# Patient Record
Sex: Female | Born: 1970 | Race: White | Hispanic: No | Marital: Married | Smoking: Current some day smoker
Health system: Southern US, Community
[De-identification: ages and names within clinical notes are randomized; demographics above are authoritative.]

## PROBLEM LIST (undated history)

## (undated) DIAGNOSIS — M549 Dorsalgia, unspecified: Secondary | ICD-10-CM

## (undated) DIAGNOSIS — J449 Chronic obstructive pulmonary disease, unspecified: Secondary | ICD-10-CM

## (undated) DIAGNOSIS — I10 Essential (primary) hypertension: Secondary | ICD-10-CM

## (undated) DIAGNOSIS — G8929 Other chronic pain: Secondary | ICD-10-CM

## (undated) HISTORY — PX: BACK SURGERY: SHX140

## (undated) HISTORY — PX: APPENDECTOMY: SHX54

## (undated) HISTORY — PX: NECK SURGERY: SHX720

## (undated) HISTORY — PX: KNEE ARTHROSCOPY: SHX127

---

## 2003-07-23 ENCOUNTER — Other Ambulatory Visit: Payer: Self-pay

## 2010-03-10 ENCOUNTER — Inpatient Hospital Stay: Payer: Self-pay | Admitting: Psychiatry

## 2010-04-19 ENCOUNTER — Ambulatory Visit: Payer: Self-pay | Admitting: Internal Medicine

## 2010-04-19 ENCOUNTER — Inpatient Hospital Stay (HOSPITAL_COMMUNITY): Admission: EM | Admit: 2010-04-19 | Discharge: 2010-04-20 | Payer: Self-pay | Admitting: Emergency Medicine

## 2010-04-19 DIAGNOSIS — N390 Urinary tract infection, site not specified: Secondary | ICD-10-CM | POA: Insufficient documentation

## 2010-04-19 DIAGNOSIS — N200 Calculus of kidney: Secondary | ICD-10-CM | POA: Insufficient documentation

## 2010-04-22 ENCOUNTER — Encounter: Payer: Self-pay | Admitting: Internal Medicine

## 2010-04-22 DIAGNOSIS — F4001 Agoraphobia with panic disorder: Secondary | ICD-10-CM

## 2010-04-22 DIAGNOSIS — M542 Cervicalgia: Secondary | ICD-10-CM | POA: Insufficient documentation

## 2010-04-22 DIAGNOSIS — F411 Generalized anxiety disorder: Secondary | ICD-10-CM | POA: Insufficient documentation

## 2010-04-22 DIAGNOSIS — I10 Essential (primary) hypertension: Secondary | ICD-10-CM | POA: Insufficient documentation

## 2010-04-22 DIAGNOSIS — F4310 Post-traumatic stress disorder, unspecified: Secondary | ICD-10-CM

## 2010-04-22 DIAGNOSIS — M549 Dorsalgia, unspecified: Secondary | ICD-10-CM | POA: Insufficient documentation

## 2010-04-22 DIAGNOSIS — Z9089 Acquired absence of other organs: Secondary | ICD-10-CM | POA: Insufficient documentation

## 2010-04-22 DIAGNOSIS — Z8659 Personal history of other mental and behavioral disorders: Secondary | ICD-10-CM

## 2010-04-26 ENCOUNTER — Emergency Department (HOSPITAL_COMMUNITY): Admission: EM | Admit: 2010-04-26 | Discharge: 2010-04-26 | Payer: Self-pay | Admitting: Emergency Medicine

## 2010-05-11 ENCOUNTER — Emergency Department: Payer: Self-pay | Admitting: Emergency Medicine

## 2010-05-19 ENCOUNTER — Ambulatory Visit: Payer: Self-pay | Admitting: Emergency Medicine

## 2010-05-25 ENCOUNTER — Ambulatory Visit: Payer: Self-pay | Admitting: Emergency Medicine

## 2010-06-15 ENCOUNTER — Emergency Department: Payer: Self-pay | Admitting: Unknown Physician Specialty

## 2010-09-22 NOTE — Miscellaneous (Signed)
  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) Added new problem of POSTTRAUMATIC STRESS DISORDER (ICD-309.81) Added new problem of BACK PAIN, CHRONIC (ICD-724.5) Added new problem of NECK PAIN, CHRONIC (ICD-723.1) Added new problem of APPENDECTOMY, HX OF (ICD-V45.79) Added new problem of TUBAL LIGATION, HX OF (ICD-V26.51) Medications: Added new medication of CIPRO 250 MG TABS (CIPROFLOXACIN HCL) 1 tab by mouth twice aday for 5 days - Signed Added new medication of ZOFRAN 4 MG TABS (ONDANSETRON HCL) take one tab by mouth q4hrs as needed for nausea - Signed Added new medication of ABILIFY 5 MG TABS (ARIPIPRAZOLE) 1 tab by mouth daily at bedtime Added new medication of BUTRANS 10 MCG/HR PTWK (BUPRENORPHINE) 1 patch topical weekly on Monday Added new medication of CELEXA 20 MG TABS (CITALOPRAM HYDROBROMIDE) 1 tab by mouth every morning Added new medication of DOXEPIN HCL 50 MG CAPS (DOXEPIN HCL) 1 capsule by mouth twice daily Added new medication of FERREX 150 150 MG CAPS (POLYSACCHARIDE IRON COMPLEX) 1 capsule by mouth daily Added new medication of FOLIC ACID 1 MG TABS (FOLIC ACID) 1 tab by mouth every morning Added new medication of LISINOPRIL-HYDROCHLOROTHIAZIDE 20-12.5 MG TABS (LISINOPRIL-HYDROCHLOROTHIAZIDE) 1 tab by mouth every morning Added new medication of OMEPRAZOLE 20 MG TBEC (OMEPRAZOLE) 1 tab by mouth every morning Added new medication of OXYCODONE-ACETAMINOPHEN 5-325 MG TABS (OXYCODONE-ACETAMINOPHEN) 1/2 to 1 tab by mouth every 8hours as needed for breakthrough pain Added new medication of KLONOPIN 1 MG TABS (CLONAZEPAM) 1 tab by mouth three times a day as  needed Added new medication of SOMA 350 MG TABS (CARISOPRODOL) 1 tab by mouth three times a day Added new medication of * VITAMIN D2 50000 UNITS 1 capsule by mouth twice a week Rx of ZOFRAN 4 MG TABS (ONDANSETRON HCL) take one tab by mouth q4hrs as needed for nausea;  #30 x 0;  Signed;  Entered by: Almyra Deforest MD;  Authorized by: Almyra Deforest MD;  Method used: Print then Give to Patient Rx of CIPRO 250 MG TABS (CIPROFLOXACIN HCL) 1 tab by mouth twice aday for 5 days;  #10 x 0;  Signed;  Entered by: Almyra Deforest MD;  Authorized by: Almyra Deforest MD;  Method used: Print then Give to Patient    Prescriptions: CIPRO 250 MG TABS (CIPROFLOXACIN HCL) 1 tab by mouth twice aday for 5 days  #10 x 0   Entered and Authorized by:   Almyra Deforest MD   Signed by:   Almyra Deforest MD on 04/22/2010   Method used:   Print then Give to Patient   RxID:   [id number] ZOFRAN 4 MG TABS (ONDANSETRON HCL) take one tab by mouth q4hrs as needed for nausea  #30 x 0   Entered and Authorized by:   Almyra Deforest MD   Signed by:   Almyra Deforest MD on 04/22/2010   Method used:   Print then Give to Patient   RxID:   [id number]

## 2010-11-05 LAB — DIFFERENTIAL
Basophils Absolute: 0.1 K/uL (ref 0.0–0.1)
Basophils Relative: 1 % (ref 0–1)
Eosinophils Absolute: 0.9 K/uL — ABNORMAL HIGH (ref 0.0–0.7)
Eosinophils Relative: 7 % — ABNORMAL HIGH (ref 0–5)
Lymphocytes Relative: 22 % (ref 12–46)
Lymphs Abs: 2.8 K/uL (ref 0.7–4.0)
Monocytes Absolute: 1.2 K/uL — ABNORMAL HIGH (ref 0.1–1.0)
Monocytes Relative: 9 % (ref 3–12)
Neutro Abs: 8 K/uL — ABNORMAL HIGH (ref 1.7–7.7)
Neutrophils Relative %: 62 % (ref 43–77)

## 2010-11-05 LAB — COMPREHENSIVE METABOLIC PANEL
ALT: 17 U/L (ref 0–35)
AST: 19 U/L (ref 0–37)
Albumin: 3.9 g/dL (ref 3.5–5.2)
BUN: 11 mg/dL (ref 6–23)
CO2: 26 mEq/L (ref 19–32)
GFR calc non Af Amer: >60 mL/min (ref >60)
Glucose, Bld: 119 mg/dL — ABNORMAL HIGH (ref 70–99)
Potassium: 3.9 mEq/L (ref 3.5–5.1)
Sodium: 135 mEq/L (ref 135–145)
Total Protein: 7.4 g/dL (ref 6.0–8.3)

## 2010-11-05 LAB — LIPASE, BLOOD: Lipase: 37 U/L (ref 11–59)

## 2010-11-05 LAB — CBC
HCT: 35.7 % — ABNORMAL LOW (ref 36.0–46.0)
Hemoglobin: 11.1 g/dL — ABNORMAL LOW (ref 12.0–15.0)
MCH: 24.4 pg — ABNORMAL LOW (ref 26.0–34.0)
MCHC: 31.1 g/dL (ref 30.0–36.0)
MCV: 78.6 fL (ref 78.0–100.0)
Platelets: 433 K/uL — ABNORMAL HIGH (ref 150–400)
RBC: 4.54 MIL/uL (ref 3.87–5.11)
RDW: 15.8 % — ABNORMAL HIGH (ref 11.5–15.5)
WBC: 13 K/uL — ABNORMAL HIGH (ref 4.0–10.5)

## 2010-11-05 LAB — URINE CULTURE
Colony Count: NO GROWTH
Culture  Setup Time: 201109050000
Culture: NO GROWTH

## 2010-11-05 LAB — URINALYSIS, ROUTINE W REFLEX MICROSCOPIC
Glucose, UA: NEGATIVE mg/dL
Hgb urine dipstick: NEGATIVE
Protein, ur: NEGATIVE mg/dL
Specific Gravity, Urine: 1.007 (ref 1.005–1.030)

## 2010-11-05 LAB — POCT PREGNANCY, URINE: Preg Test, Ur: NEGATIVE

## 2010-11-06 LAB — URINALYSIS, ROUTINE W REFLEX MICROSCOPIC
Bilirubin Urine: NEGATIVE
Glucose, UA: NEGATIVE mg/dL
Glucose, UA: NEGATIVE mg/dL
Ketones, ur: NEGATIVE mg/dL
Ketones, ur: NEGATIVE mg/dL
Specific Gravity, Urine: 1.006 (ref 1.005–1.030)
Urobilinogen, UA: 0.2 mg/dL (ref 0.0–1.0)
pH: 5.5 (ref 5.0–8.0)

## 2010-11-06 LAB — BASIC METABOLIC PANEL
BUN: 8 mg/dL (ref 6–23)
Chloride: 107 mEq/L (ref 96–112)
Glucose, Bld: 107 mg/dL — ABNORMAL HIGH (ref 70–99)
Sodium: 140 mEq/L (ref 135–145)

## 2010-11-06 LAB — CALCIUM, URINE, RANDOM: Calcium, Ur: 1 mg/dL

## 2010-11-06 LAB — POCT I-STAT, CHEM 8
Calcium, Ion: 1.1 mmol/L — ABNORMAL LOW (ref 1.12–1.32)
Chloride: 105 mEq/L (ref 96–112)
Glucose, Bld: 90 mg/dL (ref 70–99)
HCT: 37 % (ref 36.0–46.0)
Potassium: 4.5 mEq/L (ref 3.5–5.1)

## 2010-11-06 LAB — HEPATIC FUNCTION PANEL
Alkaline Phosphatase: 77 U/L (ref 39–117)
Total Bilirubin: 0.2 mg/dL — ABNORMAL LOW (ref 0.3–1.2)
Total Protein: 6.4 g/dL (ref 6.0–8.3)

## 2010-11-06 LAB — URINE CULTURE: Culture  Setup Time: 201108281142

## 2010-11-06 LAB — RAPID URINE DRUG SCREEN, HOSP PERFORMED
Cocaine: NOT DETECTED
Tetrahydrocannabinol: NOT DETECTED

## 2010-11-06 LAB — DIFFERENTIAL
Basophils Absolute: 0 10*3/uL (ref 0.0–0.1)
Basophils Relative: 0 % (ref 0–1)
Eosinophils Absolute: 1 10*3/uL — ABNORMAL HIGH (ref 0.0–0.7)
Eosinophils Relative: 10 % — ABNORMAL HIGH (ref 0–5)
Lymphocytes Relative: 20 % (ref 12–46)
Monocytes Absolute: 1.2 10*3/uL — ABNORMAL HIGH (ref 0.1–1.0)
Neutro Abs: 6.4 10*3/uL (ref 1.7–7.7)

## 2010-11-06 LAB — LIPASE, BLOOD: Lipase: 46 U/L (ref 11–59)

## 2010-11-06 LAB — URINE MICROSCOPIC-ADD ON

## 2010-11-06 LAB — HIV ANTIBODY (ROUTINE TESTING W REFLEX): HIV: NONREACTIVE

## 2010-11-06 LAB — CBC: RDW: 16.1 % — ABNORMAL HIGH (ref 11.5–15.5)

## 2013-07-22 ENCOUNTER — Observation Stay (HOSPITAL_COMMUNITY)
Admission: EM | Admit: 2013-07-22 | Discharge: 2013-07-25 | Disposition: A | Discharge status: Home / Self Care | Payer: Medicare Other | Attending: Internal Medicine | Admitting: Internal Medicine

## 2013-07-22 ENCOUNTER — Encounter (HOSPITAL_COMMUNITY): Payer: Self-pay | Admitting: Emergency Medicine

## 2013-07-22 ENCOUNTER — Emergency Department (HOSPITAL_COMMUNITY): Payer: Medicare Other

## 2013-07-22 DIAGNOSIS — Z720 Tobacco use: Secondary | ICD-10-CM | POA: Diagnosis present

## 2013-07-22 DIAGNOSIS — R0789 Other chest pain: Principal | ICD-10-CM | POA: Insufficient documentation

## 2013-07-22 DIAGNOSIS — R079 Chest pain, unspecified: Secondary | ICD-10-CM | POA: Diagnosis present

## 2013-07-22 DIAGNOSIS — K219 Gastro-esophageal reflux disease without esophagitis: Secondary | ICD-10-CM | POA: Insufficient documentation

## 2013-07-22 DIAGNOSIS — Z3202 Encounter for pregnancy test, result negative: Secondary | ICD-10-CM | POA: Insufficient documentation

## 2013-07-22 DIAGNOSIS — F17200 Nicotine dependence, unspecified, uncomplicated: Secondary | ICD-10-CM | POA: Insufficient documentation

## 2013-07-22 DIAGNOSIS — J441 Chronic obstructive pulmonary disease with (acute) exacerbation: Secondary | ICD-10-CM | POA: Insufficient documentation

## 2013-07-22 DIAGNOSIS — Z888 Allergy status to other drugs, medicaments and biological substances status: Secondary | ICD-10-CM | POA: Insufficient documentation

## 2013-07-22 DIAGNOSIS — R110 Nausea: Secondary | ICD-10-CM | POA: Insufficient documentation

## 2013-07-22 DIAGNOSIS — G8929 Other chronic pain: Secondary | ICD-10-CM | POA: Insufficient documentation

## 2013-07-22 DIAGNOSIS — I10 Essential (primary) hypertension: Secondary | ICD-10-CM | POA: Diagnosis present

## 2013-07-22 DIAGNOSIS — E663 Overweight: Secondary | ICD-10-CM | POA: Insufficient documentation

## 2013-07-22 DIAGNOSIS — L301 Dyshidrosis [pompholyx]: Secondary | ICD-10-CM | POA: Insufficient documentation

## 2013-07-22 DIAGNOSIS — I509 Heart failure, unspecified: Secondary | ICD-10-CM | POA: Insufficient documentation

## 2013-07-22 DIAGNOSIS — Z880 Allergy status to penicillin: Secondary | ICD-10-CM | POA: Insufficient documentation

## 2013-07-22 HISTORY — DX: Other chronic pain: G89.29

## 2013-07-22 HISTORY — DX: Essential (primary) hypertension: I10

## 2013-07-22 HISTORY — DX: Dorsalgia, unspecified: M54.9

## 2013-07-22 HISTORY — DX: Chronic obstructive pulmonary disease, unspecified: J44.9

## 2013-07-22 LAB — CBC WITH DIFFERENTIAL/PLATELET
Basophils Absolute: 0 10*3/uL (ref 0.0–0.1)
Basophils Relative: 0 % (ref 0–1)
HCT: 38.1 % (ref 36.0–46.0)
Lymphocytes Relative: 24 % (ref 12–46)
MCHC: 33.6 g/dL (ref 30.0–36.0)
Neutro Abs: 8.1 10*3/uL — ABNORMAL HIGH (ref 1.7–7.7)
Neutrophils Relative %: 61 % (ref 43–77)
Platelets: 303 10*3/uL (ref 150–400)
RBC: 4.42 MIL/uL (ref 3.87–5.11)
RDW: 14.2 % (ref 11.5–15.5)
WBC: 13.3 10*3/uL — ABNORMAL HIGH (ref 4.0–10.5)

## 2013-07-22 LAB — POCT I-STAT TROPONIN I: Troponin i, poc: 0 ng/mL (ref 0.00–0.08)

## 2013-07-22 LAB — URINALYSIS, ROUTINE W REFLEX MICROSCOPIC
Leukocytes, UA: NEGATIVE
Nitrite: NEGATIVE
Specific Gravity, Urine: 1.008 (ref 1.005–1.030)
Urobilinogen, UA: 0.2 mg/dL (ref 0.0–1.0)

## 2013-07-22 LAB — BASIC METABOLIC PANEL
CO2: 27 mEq/L (ref 19–32)
Chloride: 100 mEq/L (ref 96–112)
Potassium: 3.9 mEq/L (ref 3.5–5.1)
Sodium: 135 mEq/L (ref 135–145)

## 2013-07-22 LAB — D-DIMER, QUANTITATIVE: D-Dimer, Quant: 0.64 ug/mL-FEU — ABNORMAL HIGH (ref 0.00–0.48)

## 2013-07-22 MED ORDER — MORPHINE SULFATE 2 MG/ML IJ SOLN
2.0000 mg | Freq: Once | INTRAMUSCULAR | Status: AC
  Administered 2013-07-22: 2 mg via INTRAVENOUS
  Filled 2013-07-22: qty 1

## 2013-07-22 MED ORDER — SODIUM CHLORIDE 0.9 % IV BOLUS (SEPSIS)
1000.0000 mL | Freq: Once | INTRAVENOUS | Status: AC
  Administered 2013-07-22: 1000 mL via INTRAVENOUS

## 2013-07-22 NOTE — ED Provider Notes (Signed)
CSN: [id number]     Arrival date & time 07/22/13  1841 History   First MD Initiated Contact with Patient 07/22/13 1844     Chief Complaint  Patient presents with  . Chest Pain   (Consider location/radiation/quality/duration/timing/severity/associated sxs/prior Treatment) HPI  This a 42 year old female who presents with chest pain. Patient has a history of GERD, hypertension, and reported history of congestive heart failure who presents with chest pain. Patient reports onset of symptoms at 9:30am.  She reports it is constant and heavy feeling. Patient reports her pain is 7/10. She was given nitroglycerin in route. She endorses shortness of breath and diaphoresis with the episode. She reports having a stress test in 2010 but has not followed by cardiologist. She reports current smoking history and an early family history of heart disease. She denies any recent hospitalizations or recent long travel. She denies any birth control use.  Past Medical History  Diagnosis Date  . Chronic back pain     pt treated by Pain Doctor  . Hypertension   . COPD (chronic obstructive pulmonary disease)    Past Surgical History  Procedure Laterality Date  . Back surgery    . Appendectomy    . Knee arthroscopy Left   . Neck surgery     Family History  Problem Relation Age of Onset  . CAD Mother   . CAD Maternal Uncle   . Breast cancer Maternal Aunt    History  Substance Use Topics  . Smoking status: Current Some Day Smoker -- 0.50 packs/day    Types: Cigarettes  . Smokeless tobacco: Not on file  . Alcohol Use: No   OB History   Grav Para Term Preterm Abortions TAB SAB Ect Mult Living                 Review of Systems  Constitutional: Negative for fever.  Respiratory: Positive for chest tightness and shortness of breath. Negative for cough.   Cardiovascular: Positive for chest pain.  Gastrointestinal: Positive for nausea. Negative for vomiting and abdominal pain.  Genitourinary: Negative  for dysuria.  Musculoskeletal: Negative for back pain.  Neurological: Negative for headaches.  Psychiatric/Behavioral: Negative for confusion.  All other systems reviewed and are negative.    Allergies  Bee venom; Penicillins; Abilify; Lyrica; Other; Reglan; and Tegretol  Home Medications   No current outpatient prescriptions on file. BP 98/58  Pulse 58  Temp(Src) 98.4 F (36.9 C) (Oral)  Resp 18  Ht 5\' 7"  (1.702 m)  Wt 263 lb 8 oz (119.523 kg)  BMI 41.26 kg/m2  SpO2 98%  LMP 06/27/2013 Physical Exam  Nursing note and vitals reviewed. Constitutional: She is oriented to person, place, and time. No distress.  Overweight  HENT:  Head: Normocephalic and atraumatic.  Cardiovascular: Normal rate, regular rhythm and normal heart sounds.   No murmur heard. Pulmonary/Chest: Effort normal and breath sounds normal. No respiratory distress. She has no wheezes.  Abdominal: Soft. Bowel sounds are normal. There is no tenderness.  Musculoskeletal: She exhibits no edema.  Neurological: She is alert and oriented to person, place, and time.  Skin: Skin is warm and dry.  Psychiatric: She has a normal mood and affect.    ED Course  Procedures (including critical care time) Labs Review Labs Reviewed  CBC WITH DIFFERENTIAL - Abnormal; Notable for the following:    WBC 13.3 (*)    Neutro Abs 8.1 (*)    Monocytes Absolute 1.3 (*)    All other components  within normal limits  BASIC METABOLIC PANEL - Abnormal; Notable for the following:    Creatinine, Ser 1.29 (*)    GFR calc non Af Amer 51 (*)    GFR calc Af Amer 59 (*)    All other components within normal limits  D-DIMER, QUANTITATIVE - Abnormal; Notable for the following:    D-Dimer, Quant 0.64 (*)    All other components within normal limits  URINALYSIS, ROUTINE W REFLEX MICROSCOPIC  TROPONIN I  TSH  TROPONIN I  TROPONIN I  TROPONIN I  COMPREHENSIVE METABOLIC PANEL  CBC WITH DIFFERENTIAL  PREGNANCY, URINE  URINE RAPID DRUG  SCREEN (HOSP PERFORMED)  LIPID PANEL  CBC  POCT I-STAT TROPONIN I   Imaging Review Nm Pulmonary Perf And Vent  07/23/2013   CLINICAL DATA:  Chest pain for 15 hr. Evaluate for pulmonary embolism.  EXAM: NUCLEAR MEDICINE VENTILATION - PERFUSION LUNG SCAN  TECHNIQUE: Ventilation images were obtained in multiple projections using inhaled aerosol technetium 99 M DTPA. Perfusion images were obtained in multiple projections after intravenous injection of Tc-46m MAA.  COMPARISON:  Chest radiograph of 1 day prior.  RADIOPHARMACEUTICALS:  38.0 mCi Tc-7m DTPA aerosol and 6.0 mCi Tc-2m MAA  FINDINGS: Ventilation: No focal ventilation defect.  Perfusion: No wedge shaped peripheral perfusion defects to suggest acute pulmonary embolism.  IMPRESSION: No evidence of pulmonary embolism.   Electronically Signed   By: Jeronimo Greaves M.D.   On: 07/23/2013 02:19   Dg Chest Portable 1 View  07/22/2013   CLINICAL DATA:  Chest pain. The patient relates that she has been told that she has a "mass in her lung."  EXAM: PORTABLE CHEST - 1 VIEW  COMPARISON:  None.  FINDINGS: Thoracic spondylosis. Cardiac and mediastinal margins appear normal. The lungs appear clear. Cervical plate and screw fixator.  IMPRESSION: 1. Thoracic spondylosis. Otherwise, no significant abnormalities are observed. 2. I do not observe a mass in the patient's lungs. Clearly, CT would offer significantly better sensitivity and negative predictive value for pulmonary nodules, and would be the test of choice if there is concern for pulmonary nodule/malignancy.   Electronically Signed   By: Herbie Baltimore M.D.   On: 07/22/2013 19:46    EKG Interpretation    Date/Time:  Sunday July 22 2013 18:52:15 EST Ventricular Rate:  67 PR Interval:  143 QRS Duration: 83 QT Interval:  431 QTC Calculation: 455 R Axis:   56 Text Interpretation:  Sinus rhythm Normal ECG Confirmed by Trey Bebee  MD, Illene Sweeting ([id number]) on 07/22/2013 6:59:38 PM            MDM    1. Chest pain   2. HTN (hypertension)      Patient presents with chest pain. Initially noted to be hypotensive status post nitroglycerin.  She is otherwise nontoxic-appearing.  Patient does have risk factors for ACS including early family history, hypertension, and current smoking. She denies any recent fevers. Initially while the ER, patient remained persistently hypotensive and I was unable to give her pain medication or nitroglycerin. Patient was given 2 L of fluid. Patient is obese and I question the accuracy of her blood pressure readings. Blood pressure cuff was moved to her forearm with improvement of her readings.  Initial troponin is negative. EKG is nonischemic. Patient's d-dimer is positive. Patient will need a VQ scan because of hermild elevation in her creatinine. Outside records from [facility] were obtained and show the patient was admitted on October 5 for acute kidney injury. at  time of discharge patient blood pressure was 110 systolic.   IF VQ neg, patient to be admitted for CP r/o.  Signed out to Dr. Norlene Campbell.  Shon Baton, MD 07/23/13 [phone]

## 2013-07-22 NOTE — ED Notes (Signed)
Pt reports CP started at 0930 today. Pt has a HX of Gerd and felt that was the cause of CP and Pt reports a HX of Panic attacks . Pt reports the CP continued all day with nausea . EMS gve 3 ngt without relief and PT took 324 of ASA at home.

## 2013-07-23 ENCOUNTER — Emergency Department (HOSPITAL_COMMUNITY): Payer: Medicare Other

## 2013-07-23 ENCOUNTER — Encounter (HOSPITAL_COMMUNITY): Payer: Self-pay | Admitting: Internal Medicine

## 2013-07-23 DIAGNOSIS — R079 Chest pain, unspecified: Secondary | ICD-10-CM | POA: Diagnosis present

## 2013-07-23 DIAGNOSIS — Z720 Tobacco use: Secondary | ICD-10-CM | POA: Diagnosis present

## 2013-07-23 DIAGNOSIS — I10 Essential (primary) hypertension: Secondary | ICD-10-CM | POA: Diagnosis present

## 2013-07-23 LAB — CBC WITH DIFFERENTIAL/PLATELET
Eosinophils Relative: 8 % — ABNORMAL HIGH (ref 0–5)
HCT: 37 % (ref 36.0–46.0)
Hemoglobin: 12.2 g/dL (ref 12.0–15.0)
Lymphocytes Relative: 22 % (ref 12–46)
Lymphs Abs: 2 10*3/uL (ref 0.7–4.0)
MCH: 28.6 pg (ref 26.0–34.0)
MCV: 86.9 fL (ref 78.0–100.0)
Monocytes Absolute: 0.8 10*3/uL (ref 0.1–1.0)
Monocytes Relative: 9 % (ref 3–12)
Platelets: 257 10*3/uL (ref 150–400)
RBC: 4.26 MIL/uL (ref 3.87–5.11)
WBC: 9.1 10*3/uL (ref 4.0–10.5)

## 2013-07-23 LAB — LIPID PANEL
HDL: 33 mg/dL — ABNORMAL LOW (ref >39)
LDL Cholesterol: 91 mg/dL (ref 0–99)
Total CHOL/HDL Ratio: 4.6 RATIO
VLDL: 27 mg/dL (ref 0–40)

## 2013-07-23 LAB — COMPREHENSIVE METABOLIC PANEL
AST: 14 U/L (ref 0–37)
Albumin: 3 g/dL — ABNORMAL LOW (ref 3.5–5.2)
BUN: 10 mg/dL (ref 6–23)
CO2: 25 mEq/L (ref 19–32)
Chloride: 104 mEq/L (ref 96–112)
Creatinine, Ser: 1.05 mg/dL (ref 0.50–1.10)
GFR calc non Af Amer: 65 mL/min — ABNORMAL LOW (ref >90)
Potassium: 4.3 mEq/L (ref 3.5–5.1)
Sodium: 137 mEq/L (ref 135–145)
Total Protein: 6 g/dL (ref 6.0–8.3)

## 2013-07-23 LAB — TROPONIN I
Troponin I: <0.3 ng/mL (ref <0.30)
Troponin I: <0.3 ng/mL (ref <0.30)
Troponin I: <0.3 ng/mL (ref <0.30)
Troponin I: <0.3 ng/mL (ref <0.30)

## 2013-07-23 MED ORDER — SODIUM CHLORIDE 0.9 % IJ SOLN
3.0000 mL | Freq: Two times a day (BID) | INTRAMUSCULAR | Status: DC
  Administered 2013-07-23 – 2013-07-25 (×4): 3 mL via INTRAVENOUS

## 2013-07-23 MED ORDER — LISINOPRIL-HYDROCHLOROTHIAZIDE 20-12.5 MG PO TABS: 1.0000 | ORAL_TABLET | Freq: Every day | ORAL | Status: DC

## 2013-07-23 MED ORDER — ACETAMINOPHEN 325 MG PO TABS: 650.0000 mg | ORAL_TABLET | Freq: Four times a day (QID) | ORAL | Status: DC | PRN

## 2013-07-23 MED ORDER — FENTANYL CITRATE 0.05 MG/ML IJ SOLN
50.0000 ug | Freq: Once | INTRAMUSCULAR | Status: AC
  Administered 2013-07-23: 50 ug via INTRAVENOUS
  Filled 2013-07-23: qty 2

## 2013-07-23 MED ORDER — TECHNETIUM TO 99M ALBUMIN AGGREGATED: 6.0000 | Freq: Once | INTRAVENOUS | Status: DC | PRN

## 2013-07-23 MED ORDER — TECHNETIUM TC 99M DIETHYLENETRIAME-PENTAACETIC ACID: 40.0000 | Freq: Once | INTRAVENOUS | Status: DC | PRN

## 2013-07-23 MED ORDER — CLONAZEPAM 1 MG PO TABS
1.0000 mg | ORAL_TABLET | Freq: Four times a day (QID) | ORAL | Status: DC
  Administered 2013-07-23 – 2013-07-25 (×7): 1 mg via ORAL
  Filled 2013-07-23 (×8): qty 1

## 2013-07-23 MED ORDER — OXYCODONE HCL 5 MG PO TABS: 5.0000 mg | ORAL_TABLET | Freq: Four times a day (QID) | ORAL | Status: DC | PRN

## 2013-07-23 MED ORDER — ONDANSETRON HCL 4 MG PO TABS: 4.0000 mg | ORAL_TABLET | Freq: Four times a day (QID) | ORAL | Status: DC | PRN

## 2013-07-23 MED ORDER — ALBUTEROL SULFATE HFA 108 (90 BASE) MCG/ACT IN AERS
1.0000 | INHALATION_SPRAY | Freq: Four times a day (QID) | RESPIRATORY_TRACT | Status: DC | PRN
  Filled 2013-07-23: qty 6.7

## 2013-07-23 MED ORDER — OXYBUTYNIN CHLORIDE 5 MG PO TABS
5.0000 mg | ORAL_TABLET | Freq: Every day | ORAL | Status: DC
Start: 2013-07-23 — End: 2013-07-25
  Administered 2013-07-23 – 2013-07-25 (×3): 5 mg via ORAL
  Filled 2013-07-23 (×3): qty 1

## 2013-07-23 MED ORDER — HYDROCHLOROTHIAZIDE 12.5 MG PO CAPS
12.5000 mg | ORAL_CAPSULE | Freq: Every day | ORAL | Status: DC
  Administered 2013-07-24 – 2013-07-25 (×2): 12.5 mg via ORAL
  Filled 2013-07-23 (×3): qty 1

## 2013-07-23 MED ORDER — ENOXAPARIN SODIUM 60 MG/0.6ML ~~LOC~~ SOLN
60.0000 mg | SUBCUTANEOUS | Status: DC
  Administered 2013-07-23 – 2013-07-25 (×3): 60 mg via SUBCUTANEOUS
  Filled 2013-07-23 (×3): qty 0.6

## 2013-07-23 MED ORDER — LISINOPRIL 20 MG PO TABS
20.0000 mg | ORAL_TABLET | Freq: Every day | ORAL | Status: DC
  Administered 2013-07-24 – 2013-07-25 (×2): 20 mg via ORAL
  Filled 2013-07-23 (×3): qty 1

## 2013-07-23 MED ORDER — ONDANSETRON HCL 4 MG/2ML IJ SOLN: 4.0000 mg | Freq: Four times a day (QID) | INTRAMUSCULAR | Status: DC | PRN

## 2013-07-23 MED ORDER — CITALOPRAM HYDROBROMIDE 40 MG PO TABS
40.0000 mg | ORAL_TABLET | Freq: Every day | ORAL | Status: DC
Start: 2013-07-23 — End: 2013-07-25
  Administered 2013-07-23 – 2013-07-25 (×3): 40 mg via ORAL
  Filled 2013-07-23 (×3): qty 1

## 2013-07-23 MED ORDER — SODIUM CHLORIDE 0.9 % IJ SOLN: 3.0000 mL | Freq: Two times a day (BID) | INTRAMUSCULAR | Status: DC

## 2013-07-23 MED ORDER — PANTOPRAZOLE SODIUM 40 MG PO TBEC
40.0000 mg | DELAYED_RELEASE_TABLET | Freq: Every day | ORAL | Status: DC
  Administered 2013-07-23 – 2013-07-25 (×3): 40 mg via ORAL
  Filled 2013-07-23 (×3): qty 1

## 2013-07-23 MED ORDER — ACETAMINOPHEN 650 MG RE SUPP: 650.0000 mg | Freq: Four times a day (QID) | RECTAL | Status: DC | PRN

## 2013-07-23 MED ORDER — LEVOTHYROXINE SODIUM 50 MCG PO TABS
50.0000 ug | ORAL_TABLET | Freq: Every day | ORAL | Status: DC
  Administered 2013-07-23 – 2013-07-25 (×3): 50 ug via ORAL
  Filled 2013-07-23 (×4): qty 1

## 2013-07-23 MED ORDER — MORPHINE SULFATE 2 MG/ML IJ SOLN: 1.0000 mg | INTRAMUSCULAR | Status: DC | PRN

## 2013-07-23 MED ORDER — ONDANSETRON HCL 4 MG/2ML IJ SOLN
4.0000 mg | Freq: Once | INTRAMUSCULAR | Status: AC
  Administered 2013-07-23: 4 mg via INTRAVENOUS
  Filled 2013-07-23: qty 2

## 2013-07-23 MED ORDER — ASPIRIN EC 325 MG PO TBEC
325.0000 mg | DELAYED_RELEASE_TABLET | Freq: Every day | ORAL | Status: DC
  Administered 2013-07-23 – 2013-07-25 (×3): 325 mg via ORAL
  Filled 2013-07-23 (×3): qty 1

## 2013-07-23 MED ORDER — FUROSEMIDE 20 MG PO TABS
20.0000 mg | ORAL_TABLET | Freq: Every day | ORAL | Status: DC
  Administered 2013-07-24 – 2013-07-25 (×2): 20 mg via ORAL
  Filled 2013-07-23 (×3): qty 1

## 2013-07-23 NOTE — ED Notes (Signed)
Patient return from nuclear med, no problems  noted

## 2013-07-23 NOTE — Progress Notes (Signed)
UR completed 

## 2013-07-23 NOTE — H&P (Signed)
[facility] History and Physical  Tammie Rojas ZOX:[id number] DOB: [birth year] DOA: 07/22/2013  Referring physician: ER physician. PCP: No primary provider on file. patient usually follows up with [facility].  Chief Complaint: Chest pain.  HPI: Tammie Rojas is a 42 y.o. female history of hypertension, ongoing tobacco abuse and family history of CAD was brought to the ER after patient had complained of chest pain. Patient started experiencing chest pain since 9 AM which was retrosternal pressure-like associated with mild shortness of breath and diaphoresis. In the ER patient was initially given nitroglycerin sublingual which dropped her blood pressure and was given 2 L normal saline. Since patient had mildly elevated d-dimer and if patient also was recently admitted in [place] in September for acute renal failure (patient does not recall the cause or her creatinine at the time) patient had VQ scan which was low probability for PE and cardiac markers were negative chest pain got better after fentanyl and morphine has been admitted for further management. Presently patient feels well and is not in acute distress. Patient denies any nausea vomiting abdominal pain fever chills productive cough.  Review of Systems: As presented in the history of presenting illness, rest negative.  Past Medical History  Diagnosis Date  . Chronic back pain     pt treated by Pain Doctor  . Hypertension   . COPD (chronic obstructive pulmonary disease)    Past Surgical History  Procedure Laterality Date  . Back surgery    . Appendectomy    . Knee arthroscopy Left   . Neck surgery     Social History:  reports that she has been smoking Cigarettes.  She has been smoking about 0.50 packs per day. She does not have any smokeless tobacco history on file. She reports that she does not drink alcohol or use illicit drugs. Where does patient live home. Can patient participate in ADLs? Yes.  Allergies   Allergen Reactions  . Bee Venom Anaphylaxis  . Penicillins Anaphylaxis  . Abilify [Aripiprazole]   . Lyrica [Pregabalin]     swelling  . Other     Makes mouth swell  . Reglan [Metoclopramide]     anxiety  . Tegretol [Carbamazepine]     Stroke like symptoms    Family History:  Family History  Problem Relation Age of Onset  . CAD Mother   . CAD Maternal Uncle   . Breast cancer Maternal Aunt       Prior to Admission medications   Medication Sig Start Date End Date Taking? Authorizing Provider  albuterol (PROVENTIL HFA;VENTOLIN HFA) 108 (90 BASE) MCG/ACT inhaler Inhale 1-2 puffs into the lungs every 6 (six) hours as needed for wheezing or shortness of breath.   Yes Historical Provider, MD  citalopram (CELEXA) 40 MG tablet Take 40 mg by mouth daily.   Yes Historical Provider, MD  clonazePAM (KLONOPIN) 1 MG tablet Take 1 mg by mouth 4 (four) times daily.   Yes Historical Provider, MD  furosemide (LASIX) 20 MG tablet Take 20 mg by mouth daily.   Yes Historical Provider, MD  levothyroxine (SYNTHROID, LEVOTHROID) 50 MCG tablet Take 50 mcg by mouth daily before breakfast.   Yes Historical Provider, MD  lisinopril-hydrochlorothiazide (PRINZIDE,ZESTORETIC) 20-12.5 MG per tablet Take 1 tablet by mouth daily.   Yes Historical Provider, MD  naproxen sodium (ANAPROX) 220 MG tablet Take 440 mg by mouth 2 (two) times daily as needed (for headache).   Yes Historical Provider, MD  omeprazole (PRILOSEC) 40  MG capsule Take 40 mg by mouth every morning.   Yes Historical Provider, MD  oxybutynin (DITROPAN) 5 MG tablet Take 5 mg by mouth daily.    Yes Historical Provider, MD  oxyCODONE (OXY IR/ROXICODONE) 5 MG immediate release tablet Take 5 mg by mouth every 6 (six) hours as needed for severe pain.   Yes Historical Provider, MD  SUMAtriptan (IMITREX) 50 MG tablet Take 50 mg by mouth every 2 (two) hours as needed for migraine or headache. May repeat in 2 hours if headache persists or recurs.   Yes  Historical Provider, MD    Physical Exam: Filed Vitals:   07/23/13 0301 07/23/13 0315 07/23/13 0323 07/23/13 0348  BP: 106/59 97/53  98/58  Pulse:  58  58  Temp: 98.3 F (36.8 C)  98 F (36.7 C) 98.4 F (36.9 C)  TempSrc: Oral   Oral  Resp: 20 16  18   Height:    5\' 7"  (1.702 m)  Weight:    119.523 kg (263 lb 8 oz)  SpO2: 95% 95% 95% 98%     General:  Well-developed and nourished.  Eyes: Anicteric no pallor.  ENT: No discharge from ears eyes nose mouth.  Neck: No mass felt.  Cardiovascular: S1-S2 heard.  Respiratory: No rhonchi or crepitations.  Abdomen: Soft nontender bowel sounds present. No guarding no rigidity.  Skin: No rash.  Musculoskeletal: No edema.  Psychiatric: Appears normal.  Neurologic: Alert awake oriented to time place and person. Moves all extremities.  Labs on Admission:  Basic Metabolic Panel:  Recent Labs Lab 07/22/13 1908  NA 135  K 3.9  CL 100  CO2 27  GLUCOSE 95  BUN 11  CREATININE 1.29*  CALCIUM 8.6   Liver Function Tests: No results found for this basename: AST, ALT, ALKPHOS, BILITOT, PROT, ALBUMIN,  in the last 168 hours No results found for this basename: LIPASE, AMYLASE,  in the last 168 hours No results found for this basename: AMMONIA,  in the last 168 hours CBC:  Recent Labs Lab 07/22/13 1908  WBC 13.3*  NEUTROABS 8.1*  HGB 12.8  HCT 38.1  MCV 86.2  PLT 303   Cardiac Enzymes:  Recent Labs Lab 07/23/13 0304  TROPONINI <0.30    BNP (last 3 results) No results found for this basename: PROBNP,  in the last 8760 hours CBG: No results found for this basename: GLUCAP,  in the last 168 hours  Radiological Exams on Admission: Nm Pulmonary Perf And Vent  07/23/2013   CLINICAL DATA:  Chest pain for 15 hr. Evaluate for pulmonary embolism.  EXAM: NUCLEAR MEDICINE VENTILATION - PERFUSION LUNG SCAN  TECHNIQUE: Ventilation images were obtained in multiple projections using inhaled aerosol technetium 99 M DTPA.  Perfusion images were obtained in multiple projections after intravenous injection of Tc-67m MAA.  COMPARISON:  Chest radiograph of 1 day prior.  RADIOPHARMACEUTICALS:  38.0 mCi Tc-93m DTPA aerosol and 6.0 mCi Tc-65m MAA  FINDINGS: Ventilation: No focal ventilation defect.  Perfusion: No wedge shaped peripheral perfusion defects to suggest acute pulmonary embolism.  IMPRESSION: No evidence of pulmonary embolism.   Electronically Signed   By: Jeronimo Greaves M.D.   On: 07/23/2013 02:19   Dg Chest Portable 1 View  07/22/2013   CLINICAL DATA:  Chest pain. The patient relates that she has been told that she has a "mass in her lung."  EXAM: PORTABLE CHEST - 1 VIEW  COMPARISON:  None.  FINDINGS: Thoracic spondylosis. Cardiac and mediastinal margins appear normal.  The lungs appear clear. Cervical plate and screw fixator.  IMPRESSION: 1. Thoracic spondylosis. Otherwise, no significant abnormalities are observed. 2. I do not observe a mass in the patient's lungs. Clearly, CT would offer significantly better sensitivity and negative predictive value for pulmonary nodules, and would be the test of choice if there is concern for pulmonary nodule/malignancy.   Electronically Signed   By: Herbie Baltimore M.D.   On: 07/22/2013 19:46    EKG: Independently reviewed. Normal sinus rhythm.  Assessment/Plan Principal Problem:   Chest pain Active Problems:   HTN (hypertension)   Tobacco abuse   1. Chest pain - given the character of the chest pain, risk factors including family history, hypertension and ongoing tobacco abuse patient has been admitted for observation. Cycle cardiac markers check 2-D echo. Aspirin. 2. History of hypertension - presently continue home medications. Patient's blood pressure was low when nitroglycerin was given but responded to fluids. 3. Renal failure - patient states that in September she was admitted at [facility] for renal failure. Cause of which patient does not recall. Patient also  does not recall how high the creatinine went. Closely follow intake output and metabolic panel. 4. Hypothyroidism - continue Synthroid. 5. COPD - presently not wheezing. 6. Mild leukocytosis - patient is afebrile. Follow CBC. 7. Tobacco abuse - strongly advised to quit smoking.    Code Status: Full code. Family Communication: None.  Disposition Plan: Admit for observation.    Liboria Putnam N. [facility] Pager [phone].  If 7PM-7AM, please contact night-coverage [email or web address] Password [facility] 07/23/2013, 6:16 AM

## 2013-07-23 NOTE — Progress Notes (Signed)
Echocardiogram 2D Echocardiogram has been performed.  Tammie Rojas 07/23/2013, 10:26 AM

## 2013-07-23 NOTE — ED Notes (Signed)
Nuclear med tech have been called in awaiting for patient to undergo v/q scan

## 2013-07-23 NOTE — Consult Note (Signed)
Tammie Rojas is an 42 y.o. female.   Chief Complaint: Chest pain HPI: 43 year old female with history of hypertension and history of tobacco use disorder had retrosternal, pressure like chest pain with shortness of breath and sweating spell. Echocardiogram showed good LV systolic function with minimal MR and TR. EKG-Normal sinus rhythm.  Past Medical History  Diagnosis Date  . Chronic back pain     pt treated by Pain Doctor  . Hypertension   . COPD (chronic obstructive pulmonary disease)       Past Surgical History  Procedure Laterality Date  . Back surgery    . Appendectomy    . Knee arthroscopy Left   . Neck surgery      Family History  Problem Relation Age of Onset  . CAD Mother   . CAD Maternal Uncle   . Breast cancer Maternal Aunt    Social History:  reports that she has been smoking Cigarettes.  She has been smoking about 0.50 packs per day. She does not have any smokeless tobacco history on file. She reports that she does not drink alcohol or use illicit drugs.  Allergies:  Allergies  Allergen Reactions  . Bee Venom Anaphylaxis  . Penicillins Anaphylaxis  . Abilify [Aripiprazole]   . Lyrica [Pregabalin]     swelling  . Other     Makes mouth swell  . Reglan [Metoclopramide]     anxiety  . Tegretol [Carbamazepine]     Stroke like symptoms    Medications Prior to Admission  Medication Sig Dispense Refill  . albuterol (PROVENTIL HFA;VENTOLIN HFA) 108 (90 BASE) MCG/ACT inhaler Inhale 1-2 puffs into the lungs every 6 (six) hours as needed for wheezing or shortness of breath.      . citalopram (CELEXA) 40 MG tablet Take 40 mg by mouth daily.      . clonazePAM (KLONOPIN) 1 MG tablet Take 1 mg by mouth 4 (four) times daily.      . furosemide (LASIX) 20 MG tablet Take 20 mg by mouth daily.      Marland Kitchen levothyroxine (SYNTHROID, LEVOTHROID) 50 MCG tablet Take 50 mcg by mouth daily before breakfast.      . lisinopril-hydrochlorothiazide (PRINZIDE,ZESTORETIC) 20-12.5 MG per  tablet Take 1 tablet by mouth daily.      . naproxen sodium (ANAPROX) 220 MG tablet Take 440 mg by mouth 2 (two) times daily as needed (for headache).      Marland Kitchen omeprazole (PRILOSEC) 40 MG capsule Take 40 mg by mouth every morning.      Marland Kitchen oxybutynin (DITROPAN) 5 MG tablet Take 5 mg by mouth daily.       Marland Kitchen oxyCODONE (OXY IR/ROXICODONE) 5 MG immediate release tablet Take 5 mg by mouth every 6 (six) hours as needed for severe pain.      . SUMAtriptan (IMITREX) 50 MG tablet Take 50 mg by mouth every 2 (two) hours as needed for migraine or headache. May repeat in 2 hours if headache persists or recurs.        Results for orders placed during the hospital encounter of 07/22/13 (from the past 48 hour(s))  CBC WITH DIFFERENTIAL     Status: Abnormal   Collection Time    07/22/13  7:08 PM      Result Value Range   WBC 13.3 (*) 4.0 - 10.5 K/uL   RBC 4.42  3.87 - 5.11 MIL/uL   Hemoglobin 12.8  12.0 - 15.0 g/dL   HCT 29.5  62.1 -  46.0 %   MCV 86.2  78.0 - 100.0 fL   MCH 29.0  26.0 - 34.0 pg   MCHC 33.6  30.0 - 36.0 g/dL   RDW 16.1  09.6 - 04.5 %   Platelets 303  150 - 400 K/uL   Neutrophils Relative % 61  43 - 77 %   Neutro Abs 8.1 (*) 1.7 - 7.7 K/uL   Lymphocytes Relative 24  12 - 46 %   Lymphs Abs 3.1  0.7 - 4.0 K/uL   Monocytes Relative 10  3 - 12 %   Monocytes Absolute 1.3 (*) 0.1 - 1.0 K/uL   Eosinophils Relative 5  0 - 5 %   Eosinophils Absolute 0.7  0.0 - 0.7 K/uL   Basophils Relative 0  0 - 1 %   Basophils Absolute 0.0  0.0 - 0.1 K/uL  BASIC METABOLIC PANEL     Status: Abnormal   Collection Time    07/22/13  7:08 PM      Result Value Range   Sodium 135  135 - 145 mEq/L   Potassium 3.9  3.5 - 5.1 mEq/L   Chloride 100  96 - 112 mEq/L   CO2 27  19 - 32 mEq/L   Glucose, Bld 95  70 - 99 mg/dL   BUN 11  6 - 23 mg/dL   Creatinine, Ser 4.09 (*) 0.50 - 1.10 mg/dL   Calcium 8.6  8.4 - 81.1 mg/dL   GFR calc non Af Amer 51 (*) >90 mL/min   GFR calc Af Amer 59 (*) >90 mL/min   Comment:  (NOTE)     The eGFR has been calculated using the CKD EPI equation.     This calculation has not been validated in all clinical situations.     eGFR's persistently <90 mL/min signify possible Chronic Kidney     Disease.  POCT I-STAT TROPONIN I     Status: None   Collection Time    07/22/13  7:29 PM      Result Value Range   Troponin i, poc 0.00  0.00 - 0.08 ng/mL   Comment 3            Comment: Due to the release kinetics of cTnI,     a negative result within the first hours     of the onset of symptoms does not rule out     myocardial infarction with certainty.     If myocardial infarction is still suspected,     repeat the test at appropriate intervals.  URINALYSIS, ROUTINE W REFLEX MICROSCOPIC     Status: None   Collection Time    07/22/13  8:41 PM      Result Value Range   Color, Urine YELLOW  YELLOW   APPearance CLEAR  CLEAR   Specific Gravity, Urine 1.008  1.005 - 1.030   pH 6.5  5.0 - 8.0   Glucose, UA NEGATIVE  NEGATIVE mg/dL   Hgb urine dipstick NEGATIVE  NEGATIVE   Bilirubin Urine NEGATIVE  NEGATIVE   Ketones, ur NEGATIVE  NEGATIVE mg/dL   Protein, ur NEGATIVE  NEGATIVE mg/dL   Urobilinogen, UA 0.2  0.0 - 1.0 mg/dL   Nitrite NEGATIVE  NEGATIVE   Leukocytes, UA NEGATIVE  NEGATIVE   Comment: MICROSCOPIC NOT DONE ON URINES WITH NEGATIVE PROTEIN, BLOOD, LEUKOCYTES, NITRITE, OR GLUCOSE <1000 mg/dL.  D-DIMER, QUANTITATIVE     Status: Abnormal   Collection Time    07/22/13  9:22 PM  Result Value Range   D-Dimer, Quant 0.64 (*) 0.00 - 0.48 ug/mL-FEU   Comment:            AT THE INHOUSE ESTABLISHED CUTOFF     VALUE OF 0.48 ug/mL FEU,     THIS ASSAY HAS BEEN DOCUMENTED     IN THE LITERATURE TO HAVE     A SENSITIVITY AND NEGATIVE     PREDICTIVE VALUE OF AT LEAST     98 TO 99%.  THE TEST RESULT     SHOULD BE CORRELATED WITH     AN ASSESSMENT OF THE CLINICAL     PROBABILITY OF DVT / VTE.  TROPONIN I     Status: None   Collection Time    07/23/13  3:04 AM       Result Value Range   Troponin I <0.30  <0.30 ng/mL   Comment:            Due to the release kinetics of cTnI,     a negative result within the first hours     of the onset of symptoms does not rule out     myocardial infarction with certainty.     If myocardial infarction is still suspected,     repeat the test at appropriate intervals.  TROPONIN I     Status: None   Collection Time    07/23/13  8:04 AM      Result Value Range   Troponin I <0.30  <0.30 ng/mL   Comment:            Due to the release kinetics of cTnI,     a negative result within the first hours     of the onset of symptoms does not rule out     myocardial infarction with certainty.     If myocardial infarction is still suspected,     repeat the test at appropriate intervals.  COMPREHENSIVE METABOLIC PANEL     Status: Abnormal   Collection Time    07/23/13  8:04 AM      Result Value Range   Sodium 137  135 - 145 mEq/L   Potassium 4.3  3.5 - 5.1 mEq/L   Chloride 104  96 - 112 mEq/L   CO2 25  19 - 32 mEq/L   Glucose, Bld 81  70 - 99 mg/dL   BUN 10  6 - 23 mg/dL   Creatinine, Ser 1.61  0.50 - 1.10 mg/dL   Calcium 8.5  8.4 - 09.6 mg/dL   Total Protein 6.0  6.0 - 8.3 g/dL   Albumin 3.0 (*) 3.5 - 5.2 g/dL   AST 14  0 - 37 U/L   ALT 14  0 - 35 U/L   Alkaline Phosphatase 78  39 - 117 U/L   Total Bilirubin 0.2 (*) 0.3 - 1.2 mg/dL   GFR calc non Af Amer 65 (*) >90 mL/min   GFR calc Af Amer 75 (*) >90 mL/min   Comment: (NOTE)     The eGFR has been calculated using the CKD EPI equation.     This calculation has not been validated in all clinical situations.     eGFR's persistently <90 mL/min signify possible Chronic Kidney     Disease.  CBC WITH DIFFERENTIAL     Status: Abnormal   Collection Time    07/23/13  8:04 AM      Result Value Range   WBC 9.1  4.0 - 10.5 K/uL  RBC 4.26  3.87 - 5.11 MIL/uL   Hemoglobin 12.2  12.0 - 15.0 g/dL   HCT 19.1  47.8 - 29.5 %   MCV 86.9  78.0 - 100.0 fL   MCH 28.6  26.0 - 34.0  pg   MCHC 33.0  30.0 - 36.0 g/dL   RDW 62.1  30.8 - 65.7 %   Platelets 257  150 - 400 K/uL   Neutrophils Relative % 60  43 - 77 %   Neutro Abs 5.5  1.7 - 7.7 K/uL   Lymphocytes Relative 22  12 - 46 %   Lymphs Abs 2.0  0.7 - 4.0 K/uL   Monocytes Relative 9  3 - 12 %   Monocytes Absolute 0.8  0.1 - 1.0 K/uL   Eosinophils Relative 8 (*) 0 - 5 %   Eosinophils Absolute 0.7  0.0 - 0.7 K/uL   Basophils Relative 1  0 - 1 %   Basophils Absolute 0.1  0.0 - 0.1 K/uL  LIPID PANEL     Status: Abnormal   Collection Time    07/23/13  8:04 AM      Result Value Range   Cholesterol 151  0 - 200 mg/dL   Triglycerides 846  <962 mg/dL   HDL 33 (*) >95 mg/dL   Total CHOL/HDL Ratio 4.6     VLDL 27  0 - 40 mg/dL   LDL Cholesterol 91  0 - 99 mg/dL   Comment:            Total Cholesterol/HDL:CHD Risk     Coronary Heart Disease Risk Table                         Men   Women      1/2 Average Risk   3.4   3.3      Average Risk       5.0   4.4      2 X Average Risk   9.6   7.1      3 X Average Risk  23.4   11.0                Use the calculated Patient Ratio     above and the CHD Risk Table     to determine the patient's CHD Risk.                ATP III CLASSIFICATION (LDL):      <100     mg/dL   Optimal      284-132  mg/dL   Near or Above                        Optimal      130-159  mg/dL   Borderline      440-102  mg/dL   High      >725     mg/dL   Very High   Nm Pulmonary Perf And Vent  07/23/2013   CLINICAL DATA:  Chest pain for 15 hr. Evaluate for pulmonary embolism.  EXAM: NUCLEAR MEDICINE VENTILATION - PERFUSION LUNG SCAN  TECHNIQUE: Ventilation images were obtained in multiple projections using inhaled aerosol technetium 99 M DTPA. Perfusion images were obtained in multiple projections after intravenous injection of Tc-44m MAA.  COMPARISON:  Chest radiograph of 1 day prior.  RADIOPHARMACEUTICALS:  38.0 mCi Tc-42m DTPA aerosol and 6.0 mCi Tc-86m MAA  FINDINGS: Ventilation: No focal ventilation  defect.  Perfusion: No wedge shaped peripheral perfusion defects to suggest acute pulmonary embolism.  IMPRESSION: No evidence of pulmonary embolism.   Electronically Signed   By: Jeronimo Greaves M.D.   On: 07/23/2013 02:19   Dg Chest Portable 1 View  07/22/2013   CLINICAL DATA:  Chest pain. The patient relates that she has been told that she has a "mass in her lung."  EXAM: PORTABLE CHEST - 1 VIEW  COMPARISON:  None.  FINDINGS: Thoracic spondylosis. Cardiac and mediastinal margins appear normal. The lungs appear clear. Cervical plate and screw fixator.  IMPRESSION: 1. Thoracic spondylosis. Otherwise, no significant abnormalities are observed. 2. I do not observe a mass in the patient's lungs. Clearly, CT would offer significantly better sensitivity and negative predictive value for pulmonary nodules, and would be the test of choice if there is concern for pulmonary nodule/malignancy.   Electronically Signed   By: Herbie Baltimore M.D.   On: 07/22/2013 19:46    ROS  Blood pressure 98/58, pulse 58, temperature 98.4 F (36.9 C), temperature source Oral, resp. rate 18, height 5\' 7"  (1.702 m), weight 119.523 kg (263 lb 8 oz), last menstrual period 06/27/2013, SpO2 98.00%. Physical Exam: General: Well-developed and nourished.  Eyes: Anicteric no pallor.  ENT: No discharge from ears eyes nose mouth.  Neck: No mass felt.  Cardiovascular: S1-S2 heard.  Respiratory: No rhonchi or crepitations.  Abdomen: Soft nontender bowel sounds present. No guarding no rigidity.  Skin: No rash.  Musculoskeletal: No edema.  Psychiatric: Appears normal.  Neurologic: Alert awake oriented to time place and person. Moves all extremities  Assessment/Plan Chest pain Hypertension Renal insufficiency-improved. COPD Tobacco use disorder.  Nuclear stress test in AM.  Kaelen Brennan S 07/23/2013, 10:25 AM

## 2013-07-23 NOTE — ED Notes (Signed)
Patient gone down to nuclear med for V/Q scan

## 2013-07-23 NOTE — ED Notes (Signed)
Transporting Patient to new room assignment. 

## 2013-07-23 NOTE — ED Provider Notes (Signed)
Care assumed from Dr Wilkie Aye.  Pt with chest pain intermittently today, hypotension after ntg that responded to ivf.  Pain better with ntg.  She has h/o HTN, smoking, early family history of CAD.  Pt reported h/o CHF earlier, but now reports she meant COPD.  Pt was pending VQ scan and probable obs admit for chest pain.  VQ scan negative.  Pt with recent admission to [facility] 10/5 for AKI.  H/o stress test in 2010, results unknown.  Results for orders placed during the hospital encounter of 07/22/13  CBC WITH DIFFERENTIAL      Result Value Range   WBC 13.3 (*) 4.0 - 10.5 K/uL   RBC 4.42  3.87 - 5.11 MIL/uL   Hemoglobin 12.8  12.0 - 15.0 g/dL   HCT 16.1  09.6 - 04.5 %   MCV 86.2  78.0 - 100.0 fL   MCH 29.0  26.0 - 34.0 pg   MCHC 33.6  30.0 - 36.0 g/dL   RDW 40.9  81.1 - 91.4 %   Platelets 303  150 - 400 K/uL   Neutrophils Relative % 61  43 - 77 %   Neutro Abs 8.1 (*) 1.7 - 7.7 K/uL   Lymphocytes Relative 24  12 - 46 %   Lymphs Abs 3.1  0.7 - 4.0 K/uL   Monocytes Relative 10  3 - 12 %   Monocytes Absolute 1.3 (*) 0.1 - 1.0 K/uL   Eosinophils Relative 5  0 - 5 %   Eosinophils Absolute 0.7  0.0 - 0.7 K/uL   Basophils Relative 0  0 - 1 %   Basophils Absolute 0.0  0.0 - 0.1 K/uL  BASIC METABOLIC PANEL      Result Value Range   Sodium 135  135 - 145 mEq/L   Potassium 3.9  3.5 - 5.1 mEq/L   Chloride 100  96 - 112 mEq/L   CO2 27  19 - 32 mEq/L   Glucose, Bld 95  70 - 99 mg/dL   BUN 11  6 - 23 mg/dL   Creatinine, Ser 7.82 (*) 0.50 - 1.10 mg/dL   Calcium 8.6  8.4 - 95.6 mg/dL   GFR calc non Af Amer 51 (*) >90 mL/min   GFR calc Af Amer 59 (*) >90 mL/min  URINALYSIS, ROUTINE W REFLEX MICROSCOPIC      Result Value Range   Color, Urine YELLOW  YELLOW   APPearance CLEAR  CLEAR   Specific Gravity, Urine 1.008  1.005 - 1.030   pH 6.5  5.0 - 8.0   Glucose, UA NEGATIVE  NEGATIVE mg/dL   Hgb urine dipstick NEGATIVE  NEGATIVE   Bilirubin Urine NEGATIVE  NEGATIVE   Ketones, ur NEGATIVE  NEGATIVE mg/dL    Protein, ur NEGATIVE  NEGATIVE mg/dL   Urobilinogen, UA 0.2  0.0 - 1.0 mg/dL   Nitrite NEGATIVE  NEGATIVE   Leukocytes, UA NEGATIVE  NEGATIVE  D-DIMER, QUANTITATIVE      Result Value Range   D-Dimer, Quant 0.64 (*) 0.00 - 0.48 ug/mL-FEU  POCT I-STAT TROPONIN I      Result Value Range   Troponin i, poc 0.00  0.00 - 0.08 ng/mL   Comment 3            Nm Pulmonary Perf And Vent  07/23/2013   CLINICAL DATA:  Chest pain for 15 hr. Evaluate for pulmonary embolism.  EXAM: NUCLEAR MEDICINE VENTILATION - PERFUSION LUNG SCAN  TECHNIQUE: Ventilation images were obtained in multiple  projections using inhaled aerosol technetium 99 M DTPA. Perfusion images were obtained in multiple projections after intravenous injection of Tc-65m MAA.  COMPARISON:  Chest radiograph of 1 day prior.  RADIOPHARMACEUTICALS:  38.0 mCi Tc-31m DTPA aerosol and 6.0 mCi Tc-53m MAA  FINDINGS: Ventilation: No focal ventilation defect.  Perfusion: No wedge shaped peripheral perfusion defects to suggest acute pulmonary embolism.  IMPRESSION: No evidence of pulmonary embolism.   Electronically Signed   By: Jeronimo Greaves M.D.   On: 07/23/2013 02:19   Dg Chest Portable 1 View  07/22/2013   CLINICAL DATA:  Chest pain. The patient relates that she has been told that she has a "mass in her lung."  EXAM: PORTABLE CHEST - 1 VIEW  COMPARISON:  None.  FINDINGS: Thoracic spondylosis. Cardiac and mediastinal margins appear normal. The lungs appear clear. Cervical plate and screw fixator.  IMPRESSION: 1. Thoracic spondylosis. Otherwise, no significant abnormalities are observed. 2. I do not observe a mass in the patient's lungs. Clearly, CT would offer significantly better sensitivity and negative predictive value for pulmonary nodules, and would be the test of choice if there is concern for pulmonary nodule/malignancy.   Electronically Signed   By: Herbie Baltimore M.D.   On: 07/22/2013 19:46      Olivia Mackie, MD 07/23/13 [phone]

## 2013-07-23 NOTE — Progress Notes (Signed)
[facility] PROGRESS NOTE    Tammie Rojas QMV:[id number] DOB: 10/26/1970 DOA: 07/22/2013 PCP: No primary provider on file.  HPI/Brief narrative 42 y.o. female history of hypertension, ongoing tobacco abuse and family history of CAD was brought to the ER after patient had complained of chest pain. Patient started experiencing chest pain since 9 AM on 11/30 which was retrosternal pressure-like associated with mild shortness of breath and diaphoresis. In the ER patient was initially given nitroglycerin sublingual which dropped her blood pressure and was given 2 L normal saline. Since patient had mildly elevated d-dimer and if patient also was recently admitted in [place] in September for acute renal failure (patient does not recall the cause or her creatinine at the time) patient had VQ scan which was low probability for PE and cardiac markers were negative chest pain got better after fentanyl and morphine has been admitted for further management.  Assessment/Plan:  Chest Pain - CAD risk factors: FH, HTN, tobacco abuse - Cardiology consulted and plans stress test 12/2 - Continue ASA  HTN - BP's soft. Monitor closely. - asymptomatic  Recent Acute Renal Failure - recently admitted at [facility] - Etiology unknown - resolved  COPD - stable  Tobacco Abuse - cessation counseled  Hypothroid     DVT prophylaxis: Lovenox Code Status: Full Family Communication: None at bedside Disposition Plan: Home when medically stable   Consultants:  Cardiology  Procedures:  None  Antibiotics:  None  Subjective: Denies any further CP. Sleepy this morning because she didn't get to sleep at night.  Objective: Filed Vitals:   07/23/13 0323 07/23/13 0348 07/23/13 1000 07/23/13 1420  BP:  98/58 98/51 110/60  Pulse:  58  62  Temp: 98 F (36.7 C) 98.4 F (36.9 C)  98.2 F (36.8 C)  TempSrc:  Oral  Oral  Resp:  18  18  Height:  5\' 7"  (1.702 m)    Weight:  119.523  kg (263 lb 8 oz)    SpO2: 95% 98%  97%    Intake/Output Summary (Last 24 hours) at 07/23/13 1913 Last data filed at 07/23/13 1700  Gross per 24 hour  Intake    723 ml  Output      0 ml  Net    723 ml   Filed Weights   07/23/13 0348  Weight: 119.523 kg (263 lb 8 oz)     Exam:  General exam: Obese female lying comfortably in bed. Respiratory system: Clear. No increased work of breathing. Cardiovascular system: S1 & S2 heard, RRR. No JVD, murmurs, gallops, clicks or pedal edema. Tele: SR in 60's. Gastrointestinal system: Abdomen is nondistended, soft and nontender. Normal bowel sounds heard. Central nervous system: sleeping this morning but easily arousable and oriented. No focal neurological deficits. Extremities: Symmetric 5 x 5 power.   Data Reviewed: Basic Metabolic Panel:  Recent Labs Lab 07/22/13 1908 07/23/13 0804  NA 135 137  K 3.9 4.3  CL 100 104  CO2 27 25  GLUCOSE 95 81  BUN 11 10  CREATININE 1.29* 1.05  CALCIUM 8.6 8.5   Liver Function Tests:  Recent Labs Lab 07/23/13 0804  AST 14  ALT 14  ALKPHOS 78  BILITOT 0.2*  PROT 6.0  ALBUMIN 3.0*   No results found for this basename: LIPASE, AMYLASE,  in the last 168 hours No results found for this basename: AMMONIA,  in the last 168 hours CBC:  Recent Labs Lab 07/22/13 1908 07/23/13 0804  WBC 13.3*  9.1  NEUTROABS 8.1* 5.5  HGB 12.8 12.2  HCT 38.1 37.0  MCV 86.2 86.9  PLT 303 257   Cardiac Enzymes:  Recent Labs Lab 07/23/13 0304 07/23/13 0804 07/23/13 1205  TROPONINI <0.30 <0.30 <0.30   BNP (last 3 results) No results found for this basename: PROBNP,  in the last 8760 hours CBG: No results found for this basename: GLUCAP,  in the last 168 hours  No results found for this or any previous visit (from the past 240 hour(s)).    Additional labs: 1. TSH: 3.8     Studies: Nm Pulmonary Perf And Vent  07/23/2013   CLINICAL DATA:  Chest pain for 15 hr. Evaluate for pulmonary  embolism.  EXAM: NUCLEAR MEDICINE VENTILATION - PERFUSION LUNG SCAN  TECHNIQUE: Ventilation images were obtained in multiple projections using inhaled aerosol technetium 99 M DTPA. Perfusion images were obtained in multiple projections after intravenous injection of Tc-32m MAA.  COMPARISON:  Chest radiograph of 1 day prior.  RADIOPHARMACEUTICALS:  38.0 mCi Tc-83m DTPA aerosol and 6.0 mCi Tc-25m MAA  FINDINGS: Ventilation: No focal ventilation defect.  Perfusion: No wedge shaped peripheral perfusion defects to suggest acute pulmonary embolism.  IMPRESSION: No evidence of pulmonary embolism.   Electronically Signed   By: Jeronimo Greaves M.D.   On: 07/23/2013 02:19   Dg Chest Portable 1 View  07/22/2013   CLINICAL DATA:  Chest pain. The patient relates that she has been told that she has a "mass in her lung."  EXAM: PORTABLE CHEST - 1 VIEW  COMPARISON:  None.  FINDINGS: Thoracic spondylosis. Cardiac and mediastinal margins appear normal. The lungs appear clear. Cervical plate and screw fixator.  IMPRESSION: 1. Thoracic spondylosis. Otherwise, no significant abnormalities are observed. 2. I do not observe a mass in the patient's lungs. Clearly, CT would offer significantly better sensitivity and negative predictive value for pulmonary nodules, and would be the test of choice if there is concern for pulmonary nodule/malignancy.   Electronically Signed   By: Herbie Baltimore M.D.   On: 07/22/2013 19:46        Scheduled Meds: . aspirin EC  325 mg Oral Daily  . citalopram  40 mg Oral Daily  . clonazePAM  1 mg Oral QID  . enoxaparin (LOVENOX) injection  60 mg Subcutaneous Q24H  . furosemide  20 mg Oral Daily  . hydrochlorothiazide  12.5 mg Oral Daily  . levothyroxine  50 mcg Oral QAC breakfast  . lisinopril  20 mg Oral Daily  . oxybutynin  5 mg Oral Daily  . pantoprazole  40 mg Oral Daily  . sodium chloride  3 mL Intravenous Q12H  . sodium chloride  3 mL Intravenous Q12H   Continuous Infusions:    Principal Problem:   Chest pain Active Problems:   HTN (hypertension)   Tobacco abuse    Time spent: 25 mins    Reghan Thul, MD, FACP, FHM. [facility] Pager [phone]  If 7PM-7AM, please contact night-coverage [email or web address] Password [email or web address] 07/23/2013, 7:13 PM    LOS: 1 day

## 2013-07-24 ENCOUNTER — Observation Stay (HOSPITAL_COMMUNITY): Payer: Medicare Other

## 2013-07-24 DIAGNOSIS — F17200 Nicotine dependence, unspecified, uncomplicated: Secondary | ICD-10-CM

## 2013-07-24 LAB — RAPID URINE DRUG SCREEN, HOSP PERFORMED
Amphetamines: NOT DETECTED
Barbiturates: NOT DETECTED
Benzodiazepines: POSITIVE — AB

## 2013-07-24 LAB — CBC
MCH: 29 pg (ref 26.0–34.0)
Platelets: 237 10*3/uL (ref 150–400)
RBC: 4.21 MIL/uL (ref 3.87–5.11)

## 2013-07-24 LAB — PREGNANCY, URINE: Preg Test, Ur: NEGATIVE

## 2013-07-24 LAB — BASIC METABOLIC PANEL
Calcium: 8.7 mg/dL (ref 8.4–10.5)
Chloride: 102 mEq/L (ref 96–112)
GFR calc non Af Amer: 78 mL/min — ABNORMAL LOW (ref >90)
Glucose, Bld: 82 mg/dL (ref 70–99)
Sodium: 138 mEq/L (ref 135–145)

## 2013-07-24 MED ORDER — REGADENOSON 0.4 MG/5ML IV SOLN
INTRAVENOUS | Status: AC
  Administered 2013-07-24: 0.4 mg via INTRAVENOUS
  Filled 2013-07-24: qty 5

## 2013-07-24 MED ORDER — REGADENOSON 0.4 MG/5ML IV SOLN
0.4000 mg | Freq: Once | INTRAVENOUS | Status: AC
  Administered 2013-07-24: 0.4 mg via INTRAVENOUS

## 2013-07-24 NOTE — Progress Notes (Signed)
[facility] PROGRESS NOTE    Tammie Rojas ZOX:[id number] DOB: 1970-12-19 DOA: 07/22/2013 PCP: No primary provider on file.  HPI/Brief narrative 42 y.o. female history of hypertension, ongoing tobacco abuse and family history of CAD was brought to the ER after patient had complained of chest pain. Patient started experiencing chest pain since 9 AM on 11/30 which was retrosternal pressure-like associated with mild shortness of breath and diaphoresis. In the ER patient was initially given nitroglycerin sublingual which dropped her blood pressure and was given 2 L normal saline. Since patient had mildly elevated d-dimer and if patient also was recently admitted in [place] in September for acute renal failure (patient does not recall the cause or her creatinine at the time) patient had VQ scan which was low probability for PE and cardiac markers were negative chest pain got better after fentanyl and morphine has been admitted for further management.  Assessment/Plan:  Chest Pain - CAD risk factors: FH, HTN, tobacco abuse - VQ scan negative. - Cardiology consulted and patient undergoing 2 days stress test - Continue ASA - States occasional mild chest pain. None currently.  HTN - Controlled  Recent Acute Renal Failure - recently admitted at [facility] - Etiology unknown - resolved  COPD - stable  Tobacco Abuse - cessation counseled  Hypothroid     DVT prophylaxis: Lovenox Code Status: Full Family Communication: None at bedside Disposition Plan: Home when medically stable   Consultants:  Cardiology  Procedures:  None  Antibiotics:  None  Subjective: Feels better. Occasional mild chest pain. None this morning.  Objective: Filed Vitals:   07/24/13 0909 07/24/13 0911 07/24/13 0913 07/24/13 1303  BP: 119/44 119/47 118/42 116/55  Pulse:    82  Temp:    98.8 F (37.1 C)  TempSrc:    Oral  Resp:      Height:      Weight:      SpO2:    99%     Intake/Output Summary (Last 24 hours) at 07/24/13 1557 Last data filed at 07/24/13 1300  Gross per 24 hour  Intake    780 ml  Output      0 ml  Net    780 ml   Filed Weights   07/23/13 0348  Weight: 119.523 kg (263 lb 8 oz)     Exam:  General exam: Obese female lying comfortably in bed. Respiratory system: Clear. No increased work of breathing. Cardiovascular system: S1 & S2 heard, RRR. No JVD, murmurs, gallops, clicks or pedal edema. Tele: SR in 60's. Gastrointestinal system: Abdomen is nondistended, soft and nontender. Normal bowel sounds heard. Central nervous system: alert and oriented. No focal neurological deficits. Extremities: Symmetric 5 x 5 power.   Data Reviewed: Basic Metabolic Panel:  Recent Labs Lab 07/22/13 1908 07/23/13 0804 07/24/13 0527  NA 135 137 138  K 3.9 4.3 4.3  CL 100 104 102  CO2 27 25 25   GLUCOSE 95 81 82  BUN 11 10 10   CREATININE 1.29* 1.05 0.90  CALCIUM 8.6 8.5 8.7   Liver Function Tests:  Recent Labs Lab 07/23/13 0804  AST 14  ALT 14  ALKPHOS 78  BILITOT 0.2*  PROT 6.0  ALBUMIN 3.0*   No results found for this basename: LIPASE, AMYLASE,  in the last 168 hours No results found for this basename: AMMONIA,  in the last 168 hours CBC:  Recent Labs Lab 07/22/13 1908 07/23/13 0804 07/24/13 0527  WBC 13.3* 9.1 10.6*  NEUTROABS  8.1* 5.5  --   HGB 12.8 12.2 12.2  HCT 38.1 37.0 37.4  MCV 86.2 86.9 88.8  PLT 303 257 237   Cardiac Enzymes:  Recent Labs Lab 07/23/13 0304 07/23/13 0804 07/23/13 1205 07/23/13 1801  TROPONINI <0.30 <0.30 <0.30 <0.30   BNP (last 3 results) No results found for this basename: PROBNP,  in the last 8760 hours CBG: No results found for this basename: GLUCAP,  in the last 168 hours  No results found for this or any previous visit (from the past 240 hour(s)).    Additional labs: 1. TSH: 3.8     Studies: Nm Pulmonary Perf And Vent  07/23/2013   CLINICAL DATA:  Chest pain for 15  hr. Evaluate for pulmonary embolism.  EXAM: NUCLEAR MEDICINE VENTILATION - PERFUSION LUNG SCAN  TECHNIQUE: Ventilation images were obtained in multiple projections using inhaled aerosol technetium 99 M DTPA. Perfusion images were obtained in multiple projections after intravenous injection of Tc-46m MAA.  COMPARISON:  Chest radiograph of 1 day prior.  RADIOPHARMACEUTICALS:  38.0 mCi Tc-36m DTPA aerosol and 6.0 mCi Tc-18m MAA  FINDINGS: Ventilation: No focal ventilation defect.  Perfusion: No wedge shaped peripheral perfusion defects to suggest acute pulmonary embolism.  IMPRESSION: No evidence of pulmonary embolism.   Electronically Signed   By: Jeronimo Greaves M.D.   On: 07/23/2013 02:19   Dg Chest Portable 1 View  07/22/2013   CLINICAL DATA:  Chest pain. The patient relates that she has been told that she has a "mass in her lung."  EXAM: PORTABLE CHEST - 1 VIEW  COMPARISON:  None.  FINDINGS: Thoracic spondylosis. Cardiac and mediastinal margins appear normal. The lungs appear clear. Cervical plate and screw fixator.  IMPRESSION: 1. Thoracic spondylosis. Otherwise, no significant abnormalities are observed. 2. I do not observe a mass in the patient's lungs. Clearly, CT would offer significantly better sensitivity and negative predictive value for pulmonary nodules, and would be the test of choice if there is concern for pulmonary nodule/malignancy.   Electronically Signed   By: Herbie Baltimore M.D.   On: 07/22/2013 19:46        Scheduled Meds: . aspirin EC  325 mg Oral Daily  . citalopram  40 mg Oral Daily  . clonazePAM  1 mg Oral QID  . enoxaparin (LOVENOX) injection  60 mg Subcutaneous Q24H  . furosemide  20 mg Oral Daily  . hydrochlorothiazide  12.5 mg Oral Daily  . levothyroxine  50 mcg Oral QAC breakfast  . lisinopril  20 mg Oral Daily  . oxybutynin  5 mg Oral Daily  . pantoprazole  40 mg Oral Daily  . sodium chloride  3 mL Intravenous Q12H  . sodium chloride  3 mL Intravenous Q12H    Continuous Infusions:   Principal Problem:   Chest pain Active Problems:   HTN (hypertension)   Tobacco abuse    Time spent: 25 mins    HONGALGI,ANAND, MD, FACP, FHM. [facility] Pager [phone]  If 7PM-7AM, please contact night-coverage [email or web address] Password [email or web address] 07/24/2013, 3:57 PM    LOS: 2 days

## 2013-07-24 NOTE — Plan of Care (Signed)
Problem: Phase II Progression Outcomes Goal: Stress Test if indicated Outcome: Progressing Day 1 of 2 day stress test completed

## 2013-07-24 NOTE — Consult Note (Signed)
Subjective:  Finished 1st part of 2 day stress test. No chest pain. Afebrile  Objective:  Vital Signs in the last 24 hours: Temp:  [98.1 F (36.7 C)-98.8 F (37.1 C)] 98.8 F (37.1 C) (12/02 1303) Pulse Rate:  [65-82] 82 (12/02 1303) Cardiac Rhythm:  [-] Normal sinus rhythm (12/02 0821) Resp:  [18] 18 (12/02 0543) BP: (91-122)/(42-70) 116/55 mmHg (12/02 1303) SpO2:  [96 %-100 %] 99 % (12/02 1303)  Physical Exam: BP Readings from Last 1 Encounters:  07/24/13 116/55     Wt Readings from Last 1 Encounters:  07/23/13 119.523 kg (263 lb 8 oz)    Weight change:   HEENT: [place]/AT, Eyes-PERL, EOMI, Conjunctiva-Pink, Sclera-Non-icteric Neck: No JVD, No bruit, Trachea midline. Lungs:  Clear, Bilateral. Cardiac:  Regular rhythm, normal S1 and S2, no S3.  Abdomen:  Soft, non-tender. Extremities:  No edema present. No cyanosis. No clubbing. CNS: AxOx3, Cranial nerves grossly intact, moves all 4 extremities. Right handed. Skin: Warm and dry.   Intake/Output from previous day: 12/01 0701 - 12/02 0700 In: 783 [P.O.:780; I.V.:3] Out: -     Lab Results: BMET    Component Value Date/Time   NA 138 07/24/2013 0527   K 4.3 07/24/2013 0527   CL 102 07/24/2013 0527   CO2 25 07/24/2013 0527   GLUCOSE 82 07/24/2013 0527   BUN 10 07/24/2013 0527   CREATININE 0.90 07/24/2013 0527   CALCIUM 8.7 07/24/2013 0527   GFRNONAA 78* 07/24/2013 0527   GFRAA >90 07/24/2013 0527   CBC    Component Value Date/Time   WBC 10.6* 07/24/2013 0527   RBC 4.21 07/24/2013 0527   HGB 12.2 07/24/2013 0527   HCT 37.4 07/24/2013 0527   PLT 237 07/24/2013 0527   MCV 88.8 07/24/2013 0527   MCH 29.0 07/24/2013 0527   MCHC 32.6 07/24/2013 0527   RDW 14.3 07/24/2013 0527   LYMPHSABS 2.0 07/23/2013 0804   MONOABS 0.8 07/23/2013 0804   EOSABS 0.7 07/23/2013 0804   BASOSABS 0.1 07/23/2013 0804   CARDIAC ENZYMES Lab Results  Component Value Date   TROPONINI <0.30 07/23/2013    Scheduled Meds: . aspirin EC  325 mg Oral Daily  .  citalopram  40 mg Oral Daily  . clonazePAM  1 mg Oral QID  . enoxaparin (LOVENOX) injection  60 mg Subcutaneous Q24H  . furosemide  20 mg Oral Daily  . hydrochlorothiazide  12.5 mg Oral Daily  . levothyroxine  50 mcg Oral QAC breakfast  . lisinopril  20 mg Oral Daily  . oxybutynin  5 mg Oral Daily  . pantoprazole  40 mg Oral Daily  . sodium chloride  3 mL Intravenous Q12H  . sodium chloride  3 mL Intravenous Q12H   Continuous Infusions:  PRN Meds:.acetaminophen, acetaminophen, albuterol, morphine injection, ondansetron (ZOFRAN) IV, ondansetron, oxyCODONE  Assessment/Plan: Chest pain  Hypertension  Renal insufficiency-improved.  COPD  Tobacco use disorder  Continue medical treatment.  Increase ambulation.    LOS: 2 days    Orpah Cobb  MD  07/24/2013, 7:12 PM

## 2013-07-25 MED ORDER — ASPIRIN EC 81 MG PO TBEC: 81.0000 mg | DELAYED_RELEASE_TABLET | Freq: Every day | ORAL | Status: AC

## 2013-07-25 MED ORDER — TECHNETIUM TC 99M SESTAMIBI GENERIC - CARDIOLITE
30.0000 | Freq: Once | INTRAVENOUS | Status: AC | PRN
  Administered 2013-07-25: 30 via INTRAVENOUS

## 2013-07-25 MED ORDER — TECHNETIUM TC 99M SESTAMIBI GENERIC - CARDIOLITE
30.0000 | Freq: Once | INTRAVENOUS | Status: AC | PRN
  Administered 2013-07-24: 30 via INTRAVENOUS

## 2013-07-25 NOTE — Discharge Summary (Signed)
Physician Discharge Summary  Tammie Rojas ZOX:[id number] DOB: 1971/04/03 DOA: 07/22/2013  PCP: No primary provider on file.  Admit date: 07/22/2013 Discharge date: 07/25/2013  Time spent: Less than 30 minutes  Recommendations for Outpatient Follow-up:  1. Dr. Orpah Cobb, Cardiology in 1 month. 2. PCP at [facility] in 2 weeks.  Discharge Diagnoses:  Principal Problem:   Chest pain Active Problems:   HTN (hypertension)   Tobacco abuse   Discharge Condition: Improved & Stable  Diet recommendation: Heart Healthy diet  Filed Weights   07/23/13 0348  Weight: 119.523 kg (263 lb 8 oz)    History of present illness:  42 y.o. female history of hypertension, ongoing tobacco abuse and family history of CAD was brought to the ER after patient had complained of chest pain. Patient started experiencing chest pain since 9 AM on 11/30 which was retrosternal pressure-like associated with mild shortness of breath and diaphoresis. In the ER patient was initially given nitroglycerin sublingual which dropped her blood pressure and was given 2 L normal saline. Since patient had mildly elevated d-dimer and if patient also was recently admitted in [place] in September for acute renal failure (patient does not recall the cause or her creatinine at the time) patient had VQ scan which was low probability for PE and cardiac markers were negative chest pain got better after fentanyl and morphine has been admitted for further management.   Hospital Course:  Patient was admitted to the hospital. Her CAD this factors included family history, hypertension and tobacco abuse. She was started on aspirin. Cardiology was consulted and patient underwent 2 days stress test due to her size. Stress test was negative. Cardiology cleared her for discharge and recommended continuing aspirin 81 mg daily until followup with him in one month. Patient denied any further chest pain. She has been extensively counseled  regarding tobacco cessation and she verbalized understanding. Her blood pressures were borderline soft but asymptomatic and she was continued on her home medications at discharge. She apparently had acute renal failure recently of unknown etiology for which she was hospitalized at [facility]. Acute renal failure has resolved. Her COPD was stable. Patient states that she has anxiety and PTSD and follows with outpatient psychiatry. Patient states that she was told by her PCP to have a nodule/mass in her lung which apparently is being observed at this time. She is advised to followup with her PCP regarding the same. She verbalizes understanding.  Consultations:  Cardiology  Procedures:  Stress test    Discharge Exam:  Complaints:  No further CP's  Filed Vitals:   07/24/13 2120 07/25/13 0555 07/25/13 1040 07/25/13 1308  BP: 106/59 97/48 100/47 114/68  Pulse: 63 60  80  Temp: 98.7 F (37.1 C) 98.1 F (36.7 C)  98.5 F (36.9 C)  TempSrc: Oral Oral  Oral  Resp: 18 18  18   Height:      Weight:      SpO2: 96% 97%  94%    General exam: Obese female lying comfortably in bed.  Respiratory system: Clear. No increased work of breathing.  Cardiovascular system: S1 & S2 heard, RRR. No JVD, murmurs, gallops, clicks or pedal edema. Tele: Sinus bradycardia in the 50s-sinus rhythm in the 60s.  Gastrointestinal system: Abdomen is nondistended, soft and nontender. Normal bowel sounds heard.  Central nervous system: Alert and oriented. No focal neurological deficits.  Extremities: Symmetric 5 x 5 power   Discharge Instructions      Discharge  Orders   Future Orders Complete By Expires   Call MD for:  severe uncontrolled pain  As directed    Diet - low sodium heart healthy  As directed    Increase activity slowly  As directed        Medication List         albuterol 108 (90 BASE) MCG/ACT inhaler  Commonly known as:  PROVENTIL HFA;VENTOLIN HFA  Inhale 1-2 puffs into the lungs every  6 (six) hours as needed for wheezing or shortness of breath.     aspirin EC 81 MG tablet  Take 1 tablet (81 mg total) by mouth daily.     citalopram 40 MG tablet  Commonly known as:  CELEXA  Take 40 mg by mouth daily.     clonazePAM 1 MG tablet  Commonly known as:  KLONOPIN  Take 1 mg by mouth 4 (four) times daily.     furosemide 20 MG tablet  Commonly known as:  LASIX  Take 20 mg by mouth daily.     levothyroxine 50 MCG tablet  Commonly known as:  SYNTHROID, LEVOTHROID  Take 50 mcg by mouth daily before breakfast.     lisinopril-hydrochlorothiazide 20-12.5 MG per tablet  Commonly known as:  PRINZIDE,ZESTORETIC  Take 1 tablet by mouth daily.     naproxen sodium 220 MG tablet  Commonly known as:  ANAPROX  Take 440 mg by mouth 2 (two) times daily as needed (for headache).     omeprazole 40 MG capsule  Commonly known as:  PRILOSEC  Take 40 mg by mouth every morning.     oxybutynin 5 MG tablet  Commonly known as:  DITROPAN  Take 5 mg by mouth daily.     oxyCODONE 5 MG immediate release tablet  Commonly known as:  Oxy IR/ROXICODONE  Take 5 mg by mouth every 6 (six) hours as needed for severe pain.     SUMAtriptan 50 MG tablet  Commonly known as:  IMITREX  Take 50 mg by mouth every 2 (two) hours as needed for migraine or headache. May repeat in 2 hours if headache persists or recurs.       Follow-up Information   Follow up with [facility], MD. Schedule an appointment as soon as possible for a visit in 1 month.   Specialty:  Cardiology   Contact information:   [address] [place] [address] [phone]       Follow up with Primary MD at [facility]. Schedule an appointment as soon as possible for a visit in 2 weeks.       The results of significant diagnostics from this hospitalization (including imaging, microbiology, ancillary and laboratory) are listed below for reference.    Significant Diagnostic Studies: Nm Myocar Multi W/spect  W/wall Motion / Ef  07/25/2013   CLINICAL DATA:  Chest pain.  History of hypertension and COPD.  EXAM: MYOCARDIAL IMAGING WITH SPECT (REST AND PHARMACOLOGIC-STRESS - 2 DAY PROTOCOL)  GATED LEFT VENTRICULAR WALL MOTION STUDY  LEFT VENTRICULAR EJECTION FRACTION  TECHNIQUE: Standard myocardial SPECT imaging was performed after intravenous infusion of Lexiscan under the supervision of the Cardiology staff. At peak effect of the drug, 30 mCi Tc-52m sestamibi was injected intravenously and standard myocardial SPECT imaging was performed. Subsequently, on a second day, myocardial SPECT imaging was performed after resting intravenous injection of 30 mCi Tc-49m sestamibi. Quantitative gated imaging was also performed to evaluate left ventricular wall motion, and estimate left ventricular ejection fraction.  COMPARISON:  None.  FINDINGS: SPECT images demonstrate normal left ventricular activity. There are no fixed or reversible perfusion defects.  Gated images were reviewed and demonstrate normal left ventricular wall motion and systolic thickening. The QGS ejection fraction calculated at rest is 79% with an end-diastolic volume of 72ml and an end-systolic volume of 15ml.  IMPRESSION: Normal examination. No evidence of pharmacologically induced myocardial ischemia. The calculated ejection fraction is 79%.   Electronically Signed   By: Roxy Horseman M.D.   On: 07/25/2013 11:42   Nm Pulmonary Perf And Vent  07/23/2013   CLINICAL DATA:  Chest pain for 15 hr. Evaluate for pulmonary embolism.  EXAM: NUCLEAR MEDICINE VENTILATION - PERFUSION LUNG SCAN  TECHNIQUE: Ventilation images were obtained in multiple projections using inhaled aerosol technetium 99 M DTPA. Perfusion images were obtained in multiple projections after intravenous injection of Tc-87m MAA.  COMPARISON:  Chest radiograph of 1 day prior.  RADIOPHARMACEUTICALS:  38.0 mCi Tc-54m DTPA aerosol and 6.0 mCi Tc-35m MAA  FINDINGS: Ventilation: No focal ventilation  defect.  Perfusion: No wedge shaped peripheral perfusion defects to suggest acute pulmonary embolism.  IMPRESSION: No evidence of pulmonary embolism.   Electronically Signed   By: Jeronimo Greaves M.D.   On: 07/23/2013 02:19   Dg Chest Portable 1 View  07/22/2013   CLINICAL DATA:  Chest pain. The patient relates that she has been told that she has a "mass in her lung."  EXAM: PORTABLE CHEST - 1 VIEW  COMPARISON:  None.  FINDINGS: Thoracic spondylosis. Cardiac and mediastinal margins appear normal. The lungs appear clear. Cervical plate and screw fixator.  IMPRESSION: 1. Thoracic spondylosis. Otherwise, no significant abnormalities are observed. 2. I do not observe a mass in the patient's lungs. Clearly, CT would offer significantly better sensitivity and negative predictive value for pulmonary nodules, and would be the test of choice if there is concern for pulmonary nodule/malignancy.   Electronically Signed   By: Herbie Baltimore M.D.   On: 07/22/2013 19:46    Microbiology: No results found for this or any previous visit (from the past 240 hour(s)).   Labs: Basic Metabolic Panel:  Recent Labs Lab 07/22/13 1908 07/23/13 0804 07/24/13 0527  NA 135 137 138  K 3.9 4.3 4.3  CL 100 104 102  CO2 27 25 25   GLUCOSE 95 81 82  BUN 11 10 10   CREATININE 1.29* 1.05 0.90  CALCIUM 8.6 8.5 8.7   Liver Function Tests:  Recent Labs Lab 07/23/13 0804  AST 14  ALT 14  ALKPHOS 78  BILITOT 0.2*  PROT 6.0  ALBUMIN 3.0*   No results found for this basename: LIPASE, AMYLASE,  in the last 168 hours No results found for this basename: AMMONIA,  in the last 168 hours CBC:  Recent Labs Lab 07/22/13 1908 07/23/13 0804 07/24/13 0527  WBC 13.3* 9.1 10.6*  NEUTROABS 8.1* 5.5  --   HGB 12.8 12.2 12.2  HCT 38.1 37.0 37.4  MCV 86.2 86.9 88.8  PLT 303 257 237   Cardiac Enzymes:  Recent Labs Lab 07/23/13 0304 07/23/13 0804 07/23/13 1205 07/23/13 1801  TROPONINI <0.30 <0.30 <0.30 <0.30    BNP: BNP (last 3 results) No results found for this basename: PROBNP,  in the last 8760 hours CBG: No results found for this basename: GLUCAP,  in the last 168 hours  Additional labs: 1. Fasting lipid panel: Cholesterol 151, triglycerides 137, HDL 33, LDL 91, VLDL 27 2. D-dimer: 0.64 3. Urine pregnancy test:  Negative 4. TSH: 3.801 5. UDS: Positive for benzodiazepines 6. 2-D echo 07/23/13: Study Conclusions  - Left ventricle: The cavity size was normal. There was mild concentric hypertrophy. Systolic function was normal. The estimated ejection fraction was in the range of 55% to 60%. Wall motion was normal; there were no regional wall motion abnormalities. - Mitral valve: Mild regurgitation. - Right ventricle: The cavity size was mildly dilated. Wall thickness was normal. - Right atrium: The atrium was mildly dilated. - Pulmonary arteries: Systolic pressure was mildly increased. PA peak pressure: 32mm Hg (S).     Signed:  Marcellus Scott, MD, FACP, FHM. [facility] Pager [phone]  If 7PM-7AM, please contact night-coverage [email or web address] Password [email or web address] 07/25/2013, 2:09 PM

## 2013-07-25 NOTE — Consult Note (Signed)
Subjective:  No chest pain. Passed nuclear stress test with 79 % EF.  Objective:  Vital Signs in the last 24 hours: Temp:  [98.1 F (36.7 C)-98.7 F (37.1 C)] 98.5 F (36.9 C) (12/03 1308) Pulse Rate:  [60-80] 80 (12/03 1308) Cardiac Rhythm:  [-] Normal sinus rhythm (12/03 0749) Resp:  [18] 18 (12/03 1308) BP: (97-114)/(47-68) 114/68 mmHg (12/03 1308) SpO2:  [94 %-97 %] 94 % (12/03 1308)  Physical Exam: BP Readings from Last 1 Encounters:  07/25/13 114/68     Wt Readings from Last 1 Encounters:  07/23/13 119.523 kg (263 lb 8 oz)    Weight change:   HEENT: [place]/AT, Eyes-PERL, EOMI, Conjunctiva-Pink, Sclera-Non-icteric Neck: No JVD, No bruit, Trachea midline. Lungs:  Clear, Bilateral. Cardiac:  Regular rhythm, normal S1 and S2, no S3.  Abdomen:  Soft, non-tender. Extremities:  No edema present. No cyanosis. No clubbing. CNS: AxOx3, Cranial nerves grossly intact, moves all 4 extremities. Right handed. Skin: Warm and dry.   Intake/Output from previous day: 12/02 0701 - 12/03 0700 In: 720 [P.O.:720] Out: -     Lab Results: BMET    Component Value Date/Time   NA 138 07/24/2013 0527   K 4.3 07/24/2013 0527   CL 102 07/24/2013 0527   CO2 25 07/24/2013 0527   GLUCOSE 82 07/24/2013 0527   BUN 10 07/24/2013 0527   CREATININE 0.90 07/24/2013 0527   CALCIUM 8.7 07/24/2013 0527   GFRNONAA 78* 07/24/2013 0527   GFRAA >90 07/24/2013 0527   CBC    Component Value Date/Time   WBC 10.6* 07/24/2013 0527   RBC 4.21 07/24/2013 0527   HGB 12.2 07/24/2013 0527   HCT 37.4 07/24/2013 0527   PLT 237 07/24/2013 0527   MCV 88.8 07/24/2013 0527   MCH 29.0 07/24/2013 0527   MCHC 32.6 07/24/2013 0527   RDW 14.3 07/24/2013 0527   LYMPHSABS 2.0 07/23/2013 0804   MONOABS 0.8 07/23/2013 0804   EOSABS 0.7 07/23/2013 0804   BASOSABS 0.1 07/23/2013 0804   CARDIAC ENZYMES Lab Results  Component Value Date   TROPONINI <0.30 07/23/2013    Scheduled Meds: . aspirin EC  325 mg Oral Daily  . citalopram  40  mg Oral Daily  . clonazePAM  1 mg Oral QID  . enoxaparin (LOVENOX) injection  60 mg Subcutaneous Q24H  . furosemide  20 mg Oral Daily  . hydrochlorothiazide  12.5 mg Oral Daily  . levothyroxine  50 mcg Oral QAC breakfast  . lisinopril  20 mg Oral Daily  . oxybutynin  5 mg Oral Daily  . pantoprazole  40 mg Oral Daily  . sodium chloride  3 mL Intravenous Q12H   Continuous Infusions:  PRN Meds:.acetaminophen, acetaminophen, albuterol, morphine injection, ondansetron (ZOFRAN) IV, ondansetron, oxyCODONE  Assessment/Plan:  Chest pain  Hypertension  Renal insufficiency-improved.  COPD  Tobacco use disorder  Medical treatment of non-cardiac chest pain. F/U in 1 month.   LOS: 3 days    Orpah Cobb  MD  07/25/2013, 1:57 PM

## 2013-07-25 NOTE — Progress Notes (Signed)
D/c orders received;IV removed with gauze on, pt remains in stable condition, pt meds and instructions reviewed and given to pt, pt d/c to home 

## 2014-08-12 ENCOUNTER — Emergency Department: Payer: Self-pay | Admitting: Emergency Medicine

## 2014-08-13 LAB — CBC WITH DIFFERENTIAL/PLATELET
BASOS ABS: 0.1 10*3/uL (ref 0.0–0.1)
Basophil %: 0.3 %
Eosinophil #: 0.7 10*3/uL (ref 0.0–0.7)
Eosinophil %: 4.3 %
HCT: 37.3 % (ref 35.0–47.0)
HGB: 11.5 g/dL — AB (ref 12.0–16.0)
LYMPHS ABS: 3.6 10*3/uL (ref 1.0–3.6)
LYMPHS PCT: 22.4 %
MCH: 22.7 pg — AB (ref 26.0–34.0)
MCHC: 30.8 g/dL — ABNORMAL LOW (ref 32.0–36.0)
MCV: 74 fL — AB (ref 80–100)
Monocyte #: 1.3 x10 3/mm — ABNORMAL HIGH (ref 0.2–0.9)
Monocyte %: 8.1 %
Neutrophil #: 10.5 10*3/uL — ABNORMAL HIGH (ref 1.4–6.5)
Neutrophil %: 64.9 %
PLATELETS: 406 10*3/uL (ref 150–440)
RBC: 5.06 10*6/uL (ref 3.80–5.20)
RDW: 16.7 % — ABNORMAL HIGH (ref 11.5–14.5)
WBC: 16.2 10*3/uL — AB (ref 3.6–11.0)

## 2014-08-13 LAB — URINALYSIS, COMPLETE
BLOOD: NEGATIVE
Bilirubin,UR: NEGATIVE
Glucose,UR: NEGATIVE mg/dL (ref 0–75)
Ketone: NEGATIVE
LEUKOCYTE ESTERASE: NEGATIVE
Nitrite: NEGATIVE
PROTEIN: NEGATIVE
Ph: 6 (ref 4.5–8.0)
RBC,UR: 2 /HPF (ref 0–5)
SPECIFIC GRAVITY: 1.011 (ref 1.003–1.030)
Squamous Epithelial: 1
WBC UR: 1 /HPF (ref 0–5)

## 2014-08-13 LAB — COMPREHENSIVE METABOLIC PANEL
ALBUMIN: 3.5 g/dL (ref 3.4–5.0)
ANION GAP: 8 (ref 7–16)
AST: 20 U/L (ref 15–37)
Alkaline Phosphatase: 114 U/L
BILIRUBIN TOTAL: 0.3 mg/dL (ref 0.2–1.0)
BUN: 15 mg/dL (ref 7–18)
CALCIUM: 8.7 mg/dL (ref 8.5–10.1)
CHLORIDE: 101 mmol/L (ref 98–107)
Co2: 27 mmol/L (ref 21–32)
Creatinine: 1.03 mg/dL (ref 0.60–1.30)
EGFR (African American): >60
EGFR (Non-African Amer.): >60
Glucose: 89 mg/dL (ref 65–99)
OSMOLALITY: 272 (ref 275–301)
POTASSIUM: 3.8 mmol/L (ref 3.5–5.1)
SGPT (ALT): 27 U/L
Sodium: 136 mmol/L (ref 136–145)
Total Protein: 8.1 g/dL (ref 6.4–8.2)

## 2014-08-13 LAB — LIPASE, BLOOD: Lipase: 162 U/L (ref 73–393)

## 2014-08-13 LAB — TROPONIN I

## 2015-07-20 IMAGING — CT CT ABD-PELV W/ CM
2 of 5 series · 17 of 46 positions shown, 19 images · IV contrast (omnipaque)
Comparison: Limited abdominal sonogram August 13, 2014

CLINICAL DATA: RIGHT upper quadrant pain with nausea be getting 4
days ago. History of gallbladder difficulties. Appendectomy.

EXAM:
CT ABDOMEN AND PELVIS WITH CONTRAST
TECHNIQUE: Multidetector CT imaging of the abdomen and pelvis was performed
using the standard protocol following bolus administration of
intravenous contrast.
CONTRAST:  100 cc Omnipaque 300

[Series 2: routine abd pel with · axial · 0.89mm/px · z∈[-490,-54]mm · 14 of 99 slices shown, 16 images]
[im 6/99  soft-tissue]
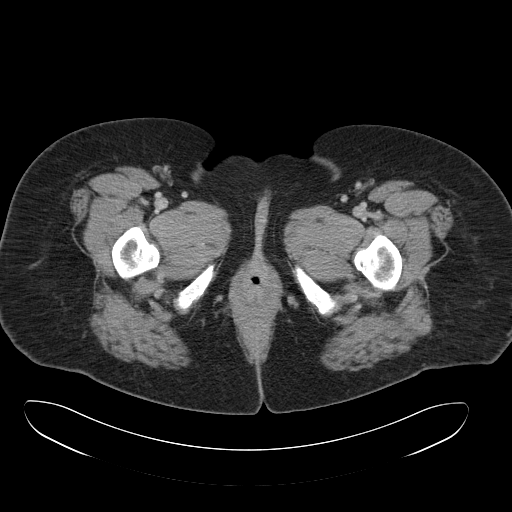
[im 6/99  bone]
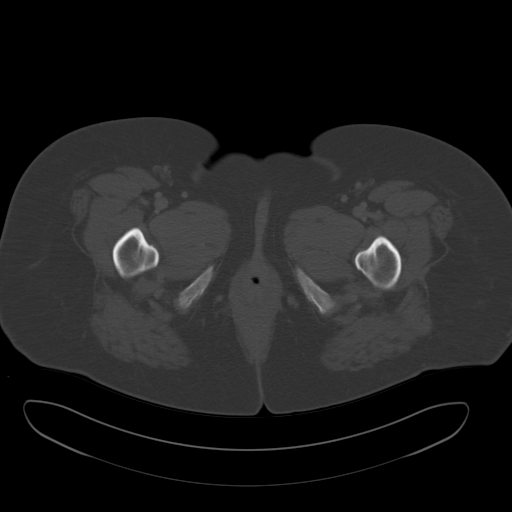
[im 11/99  soft-tissue]
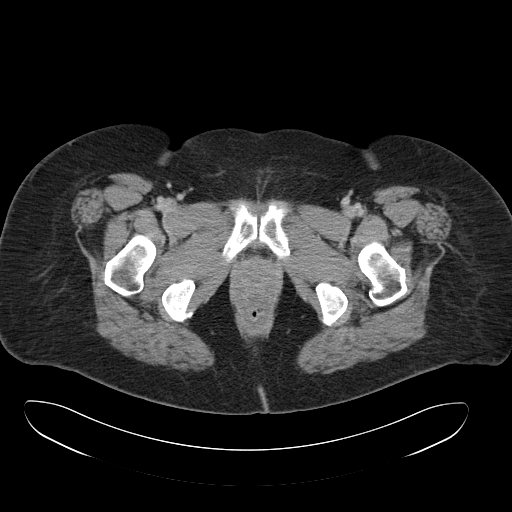
[im 21/99  soft-tissue]
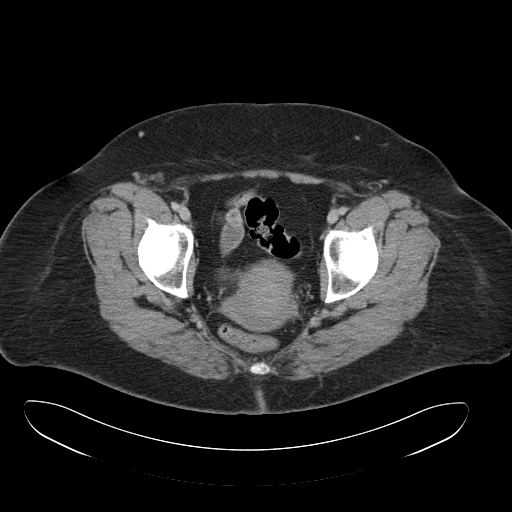
[im 26/99  soft-tissue]
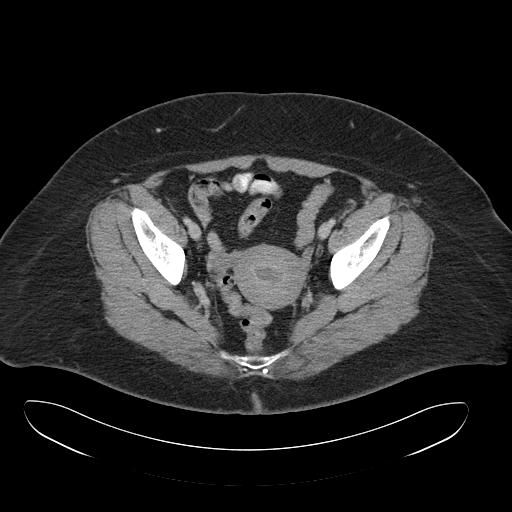
[im 31/99  soft-tissue]
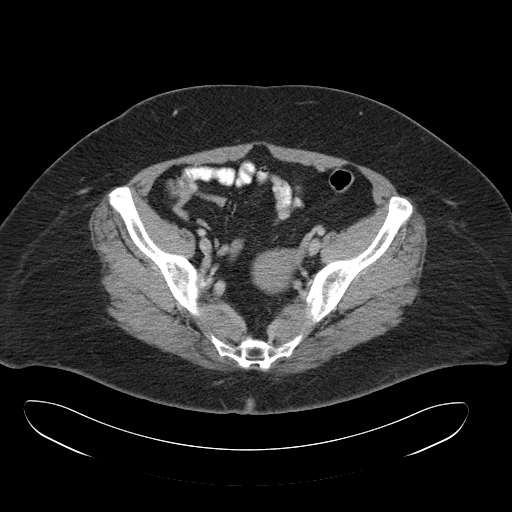
[im 42/99  soft-tissue]
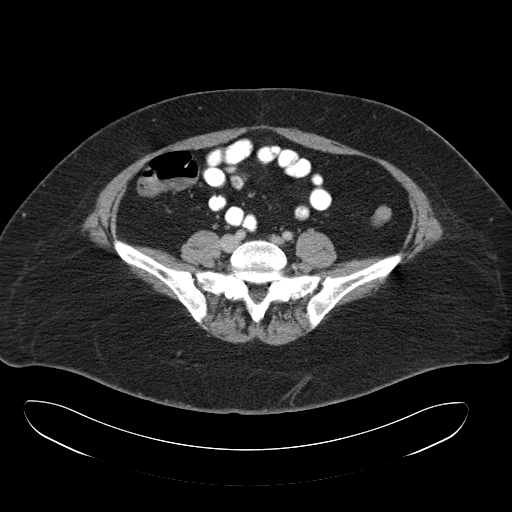
[im 47/99  soft-tissue]
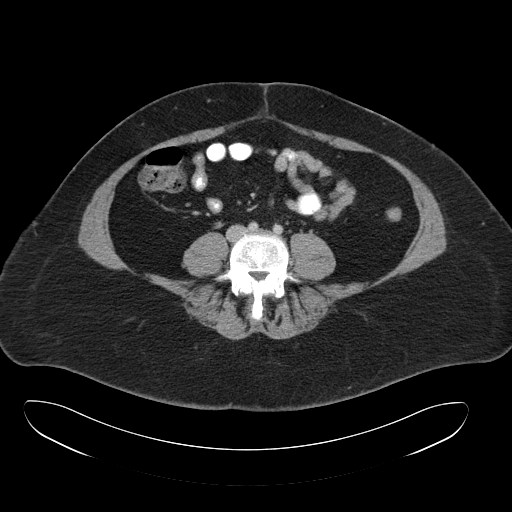
[im 52/99  soft-tissue]
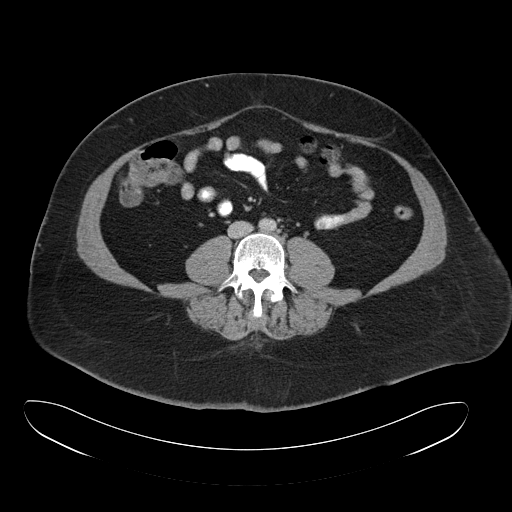
[im 57/99  soft-tissue]
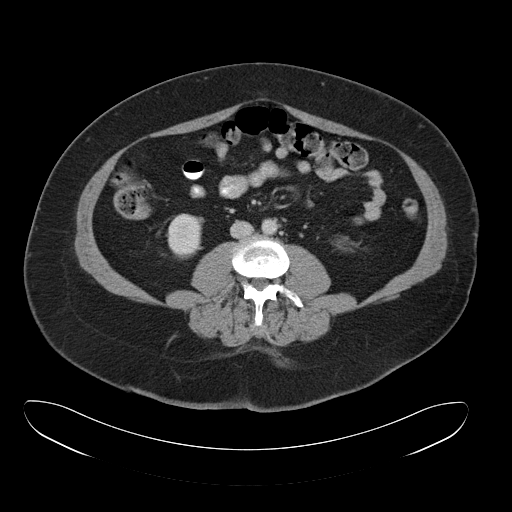
[im 57/99  bone]
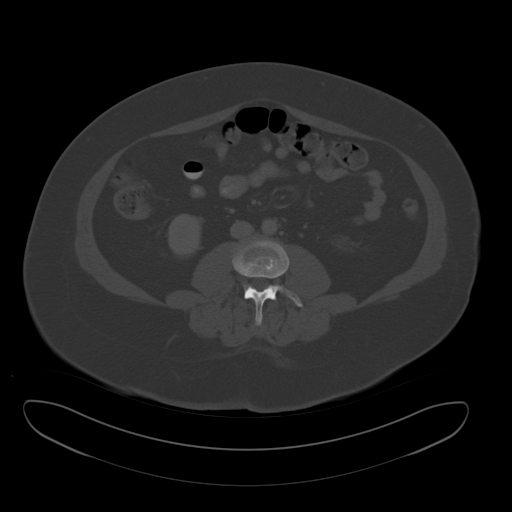
[im 68/99  soft-tissue]
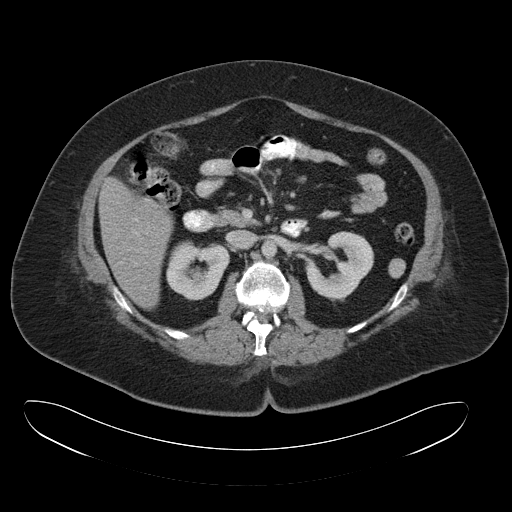
[im 73/99  soft-tissue]
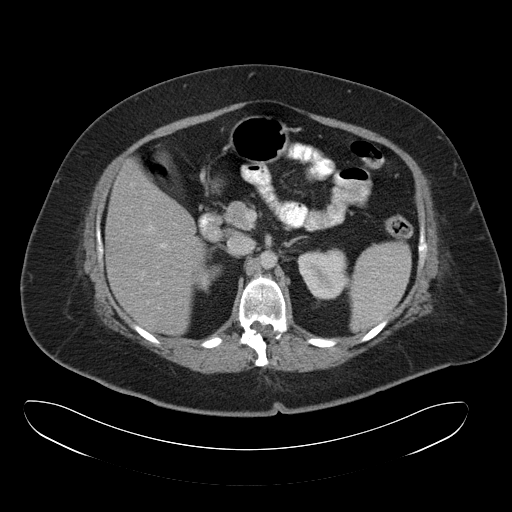
[im 78/99  soft-tissue]
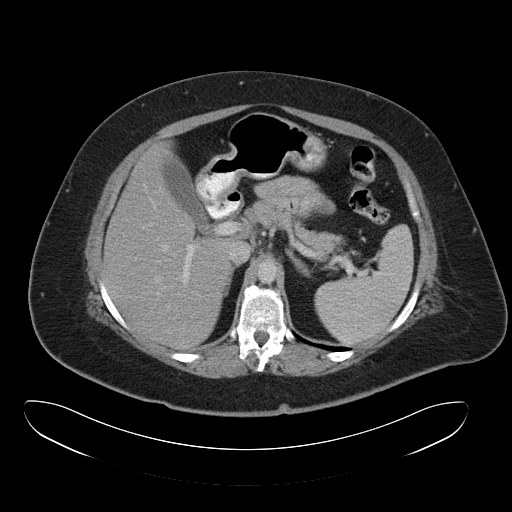
[im 88/99  soft-tissue]
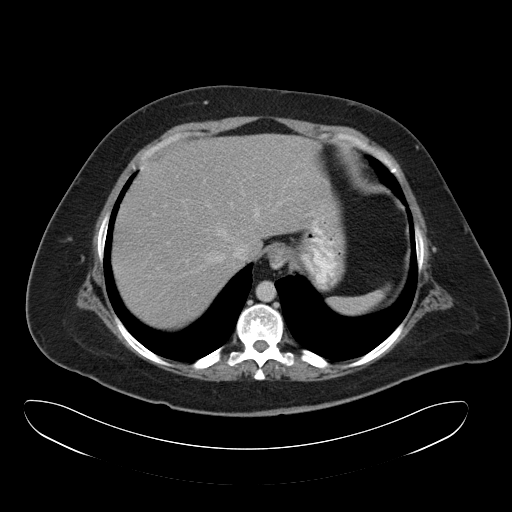
[im 93/99  soft-tissue]
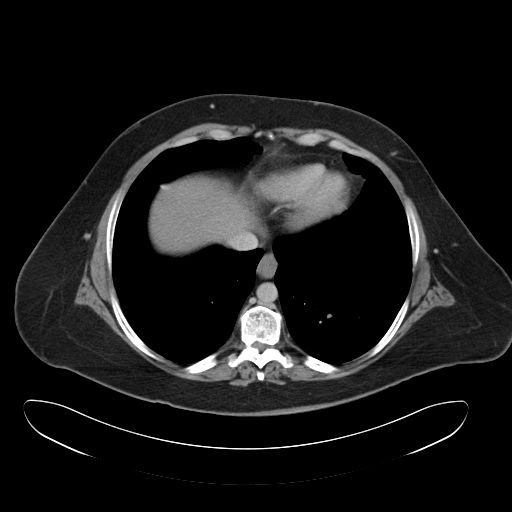

[Series 6: cor routine abd pel with · coronal · 0.91mm/px · 3 of 131 slices shown]
[im 44/131  soft-tissue]
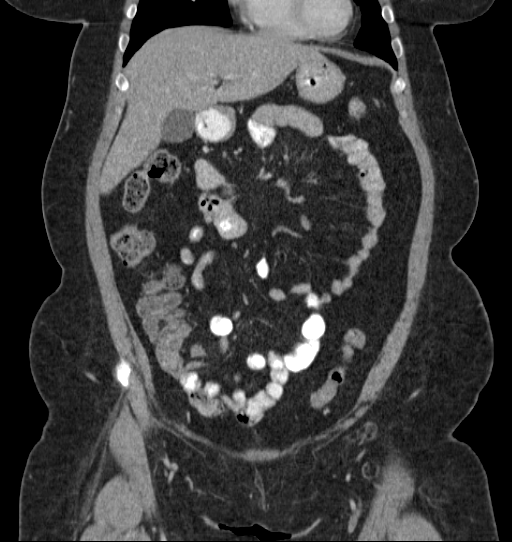
[im 58/131  soft-tissue]
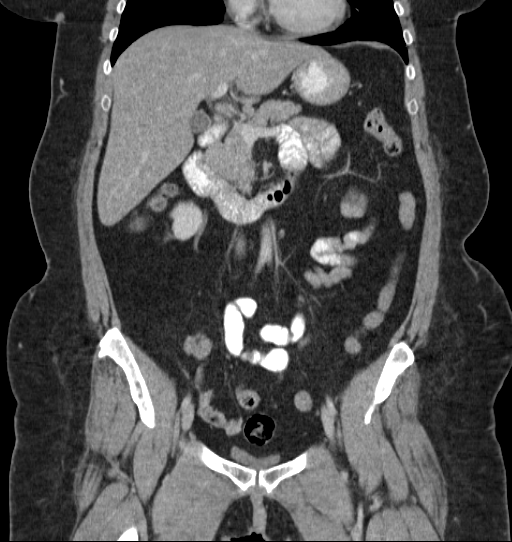
[im 73/131  soft-tissue]
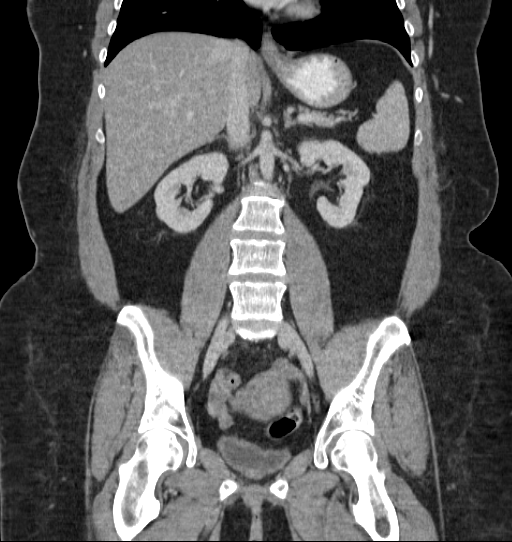

[17 of 46 positions shown; findings below may reference images not displayed]

FINDINGS: LUNG BASES: Included view of the lung bases are clear. Visualized
heart and pericardium are unremarkable.

SOLID ORGANS: The liver, spleen, gallbladder, pancreas and adrenal
glands are unremarkable.

GASTROINTESTINAL TRACT: The stomach, small and large bowel are
normal in course and caliber without inflammatory changes.
Nonvisualized appendix consistent with surgical history.

KIDNEYS/ URINARY TRACT: Kidneys are orthotopic, demonstrating
symmetric enhancement. No nephrolithiasis, hydronephrosis or solid
renal masses. The unopacified ureters are normal in course and
caliber. Delayed imaging through the kidneys demonstrates symmetric
prompt contrast excretion within the proximal urinary collecting
system. Urinary bladder is partially distended and unremarkable.

PERITONEUM/RETROPERITONEUM: No intraperitoneal free fluid nor free
air. Aortoiliac vessels are normal in course and caliber. No
lymphadenopathy by CT size criteria. Internal reproductive organs
are unremarkable.

SOFT TISSUE/OSSEOUS STRUCTURES: Nonsuspicious. Small fat containing
inguinal hernias. Scattered Schmorl's nodes.
IMPRESSION: No acute intra-abdominal pelvic process. No specific findings to
explain RIGHT upper quadrant pain.

  By: Mofidul Tiger

## 2018-02-06 ENCOUNTER — Encounter: Payer: Self-pay | Admitting: Emergency Medicine

## 2018-02-06 ENCOUNTER — Emergency Department
Admission: EM | Admit: 2018-02-06 | Discharge: 2018-02-06 | Disposition: A | Discharge status: Home / Self Care | Payer: Medicare HMO | Attending: Emergency Medicine | Admitting: Emergency Medicine

## 2018-02-06 DIAGNOSIS — W208XXA Other cause of strike by thrown, projected or falling object, initial encounter: Secondary | ICD-10-CM | POA: Diagnosis not present

## 2018-02-06 DIAGNOSIS — Z79899 Other long term (current) drug therapy: Secondary | ICD-10-CM | POA: Diagnosis not present

## 2018-02-06 DIAGNOSIS — I10 Essential (primary) hypertension: Secondary | ICD-10-CM | POA: Insufficient documentation

## 2018-02-06 DIAGNOSIS — F17210 Nicotine dependence, cigarettes, uncomplicated: Secondary | ICD-10-CM | POA: Insufficient documentation

## 2018-02-06 DIAGNOSIS — J449 Chronic obstructive pulmonary disease, unspecified: Secondary | ICD-10-CM | POA: Diagnosis not present

## 2018-02-06 DIAGNOSIS — Y9389 Activity, other specified: Secondary | ICD-10-CM | POA: Insufficient documentation

## 2018-02-06 DIAGNOSIS — Y92512 Supermarket, store or market as the place of occurrence of the external cause: Secondary | ICD-10-CM | POA: Insufficient documentation

## 2018-02-06 DIAGNOSIS — Y999 Unspecified external cause status: Secondary | ICD-10-CM | POA: Diagnosis not present

## 2018-02-06 DIAGNOSIS — Z7982 Long term (current) use of aspirin: Secondary | ICD-10-CM | POA: Diagnosis not present

## 2018-02-06 DIAGNOSIS — S3992XA Unspecified injury of lower back, initial encounter: Secondary | ICD-10-CM | POA: Diagnosis not present

## 2018-02-06 MED ORDER — KETOROLAC TROMETHAMINE 30 MG/ML IJ SOLN
30.0000 mg | Freq: Once | INTRAMUSCULAR | Status: AC
  Administered 2018-02-06: 30 mg via INTRAMUSCULAR
  Filled 2018-02-06: qty 1

## 2018-02-06 MED ORDER — LIDOCAINE 5 % EX PTCH
1.0000 | MEDICATED_PATCH | CUTANEOUS | Status: DC
  Administered 2018-02-06: 1 via TRANSDERMAL
  Filled 2018-02-06: qty 1

## 2018-02-06 MED ORDER — CYCLOBENZAPRINE HCL 5 MG PO TABS: ORAL_TABLET | ORAL | 0 refills | Status: AC

## 2018-02-06 MED ORDER — KETOROLAC TROMETHAMINE 10 MG PO TABS: 10.0000 mg | ORAL_TABLET | Freq: Four times a day (QID) | ORAL | 0 refills | Status: AC | PRN

## 2018-02-06 MED ORDER — LIDOCAINE 5 % EX PTCH: 1.0000 | MEDICATED_PATCH | CUTANEOUS | 0 refills | Status: AC

## 2018-02-06 MED ORDER — ORPHENADRINE CITRATE 30 MG/ML IJ SOLN
30.0000 mg | Freq: Two times a day (BID) | INTRAMUSCULAR | Status: DC
  Administered 2018-02-06: 30 mg via INTRAMUSCULAR
  Filled 2018-02-06: qty 1

## 2018-02-06 NOTE — ED Triage Notes (Signed)
Patient states she was standing in [employer] and a worker was going to [employer] and knocked a box into patient's back.  Patient states, "It was a very heavy box."  Patient reports history of chronic back pain and back problems.

## 2018-02-06 NOTE — ED Notes (Signed)
See triage note... No bruising noted on back, pt able to ambulate and maintains full range of motion in lower extremities but does cause some pain.

## 2018-02-06 NOTE — ED Provider Notes (Signed)
[facility] Emergency Department Provider Note  ____________________________________________  Time seen: Approximately 5:11 PM  I have reviewed the triage vital signs and the nursing notes.   HISTORY  Chief Complaint Back Pain    HPI Tammie Rojas is a 47 y.o. female that presents to the emergency department for evaluation of left-sided back pain after injury today.  Patient was looking at sauces at [facility] when a box containing 2 bottles of vegetable oil fell off of a cart and hit her back.  She did not fall.  She came to the emergency department right after injury.  She does not think that anything is broken. She has chronic back pain. No SOB, nausea, vomiting, abdominal pain, numbness, tingling.    Past Medical History:  Diagnosis Date  . Chronic back pain    pt treated by Pain Doctor  . COPD (chronic obstructive pulmonary disease) (HCC)   . Hypertension     Patient Active Problem List   Diagnosis Date Noted  . Chest pain 07/23/2013  . HTN (hypertension) 07/23/2013  . Tobacco abuse 07/23/2013  . ANXIETY 04/22/2010  . 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 POSTTRAUMATIC STRESS DISORDER 04/22/2010  . ESSENTIAL HYPERTENSION, BENIGN 04/22/2010  . NECK PAIN, CHRONIC 04/22/2010  . BACK PAIN, CHRONIC 04/22/2010  . DEPRESSION, HX OF 04/22/2010  . APPENDECTOMY, HX OF 04/22/2010  . NEPHROLITHIASIS, RECURRENT 04/19/2010  . URINARY TRACT INFECTION 04/19/2010    Past Surgical History:  Procedure Laterality Date  . APPENDECTOMY    . BACK SURGERY    . KNEE ARTHROSCOPY Left   . NECK SURGERY      Prior to Admission medications   Medication Sig Start Date End Date Taking? Authorizing Provider  albuterol (PROVENTIL HFA;VENTOLIN HFA) 108 (90 BASE) MCG/ACT inhaler Inhale 1-2 puffs into the lungs every 6 (six) hours as needed for wheezing or shortness of breath.    [provider]  aspirin EC 81 MG tablet Take 1 tablet (81 mg total) by  mouth daily. 07/25/13   Hongalgi, Maximino Greenland, MD  citalopram (CELEXA) 40 MG tablet Take 40 mg by mouth daily.    [provider]  clonazePAM (KLONOPIN) 1 MG tablet Take 1 mg by mouth 4 (four) times daily.    [provider]  cyclobenzaprine (FLEXERIL) 5 MG tablet Take 1-2 tablets 3 times daily as needed 02/06/18   Enid Derry, PA-C  furosemide (LASIX) 20 MG tablet Take 20 mg by mouth daily.    [provider]  ketorolac (TORADOL) 10 MG tablet Take 1 tablet (10 mg total) by mouth every 6 (six) hours as needed. 02/06/18   Enid Derry, PA-C  levothyroxine (SYNTHROID, LEVOTHROID) 50 MCG tablet Take 50 mcg by mouth daily before breakfast.    [provider]  lidocaine (LIDODERM) 5 % Place 1 patch onto the skin daily. Remove & Discard patch within 12 hours or as directed by MD 02/06/18 02/06/19  Enid Derry, PA-C  lisinopril-hydrochlorothiazide (PRINZIDE,ZESTORETIC) 20-12.5 MG per tablet Take 1 tablet by mouth daily.    [provider]  naproxen sodium (ANAPROX) 220 MG tablet Take 440 mg by mouth 2 (two) times daily as needed (for headache).    [provider]  omeprazole (PRILOSEC) 40 MG capsule Take 40 mg by mouth every morning.    [provider]  oxybutynin (DITROPAN) 5 MG tablet Take 5 mg by mouth daily.     [provider]  oxyCODONE (OXY IR/ROXICODONE) 5 MG immediate release  tablet Take 5 mg by mouth every 6 (six) hours as needed for severe pain.    [provider]  SUMAtriptan (IMITREX) 50 MG tablet Take 50 mg by mouth every 2 (two) hours as needed for migraine or headache. May repeat in 2 hours if headache persists or recurs.    [provider]    Allergies Bee venom; Penicillins; Abilify [aripiprazole]; Lyrica [pregabalin]; Other; Reglan [metoclopramide]; and Tegretol [carbamazepine]  Family History  Problem Relation Age of Onset  . CAD Mother   . CAD Maternal Uncle   . Breast cancer Maternal Aunt      Social History Social History   Tobacco Use  . Smoking status: Current Some Day Smoker    Packs/day: 0.50    Types: Cigarettes  . Smokeless tobacco: Never Used  Substance Use Topics  . Alcohol use: No  . Drug use: No     Review of Systems  Gastrointestinal: No abdominal pain.  No nausea, no vomiting.  Musculoskeletal: Positive for back pain.  Skin: Negative for rash, abrasions, lacerations, ecchymosis. Neurological: Negative for numbness or tingling   ____________________________________________   PHYSICAL EXAM:  VITAL SIGNS: ED Triage Vitals  Enc Vitals Group     BP 02/06/18 1543 (!) 142/70     Pulse Rate 02/06/18 1543 (!) 102     Resp 02/06/18 1543 16     Temp 02/06/18 1543 99.9 F (37.7 C)     Temp Source 02/06/18 1543 Oral     SpO2 02/06/18 1543 96 %     Weight 02/06/18 1544 255 lb (115.7 kg)     Height 02/06/18 1544 5\' 6"  (1.676 m)     Head Circumference --      Peak Flow --      Pain Score 02/06/18 1544 8     Pain Loc --      Pain Edu? --      Excl. in GC? --      Constitutional: Alert and oriented. Well appearing and in no acute distress. Eyes: Conjunctivae are normal. PERRL. EOMI. Head: Atraumatic. ENT:      Ears:      Nose: No congestion/rhinnorhea.      Mouth/Throat: Mucous membranes are moist.  Neck: No stridor.   Cardiovascular: Normal rate, regular rhythm.  Good peripheral circulation. Respiratory: Normal respiratory effort without tachypnea or retractions. Lungs CTAB. Good air entry to the bases with no decreased or absent breath sounds. Gastrointestinal: Bowel sounds 4 quadrants. Soft and nontender to palpation. No guarding or rigidity. No palpable masses. No distention.  Musculoskeletal: Full range of motion to all extremities. No gross deformities appreciated. Mild tenderness to palpation over left lumbar paraspinal muscles.  Neurologic:  Normal speech and language. No gross focal neurologic deficits are appreciated.  Skin:  Skin is  warm, dry and intact. No rash noted. No ecchymosis or erythema to back.  Psychiatric: Mood and affect are normal. Speech and behavior are normal. Patient exhibits appropriate insight and judgement.   ____________________________________________   LABS (all labs ordered are listed, but only abnormal results are displayed)  Labs Reviewed - No data to display ____________________________________________  EKG   ____________________________________________  RADIOLOGY  No results found.  ____________________________________________    PROCEDURES  Procedure(s) performed:    Procedures    Medications  lidocaine (LIDODERM) 5 % 1 patch (1 patch Transdermal Patch Applied 02/06/18 1704)  orphenadrine (NORFLEX) injection 30 mg (30 mg Intramuscular Given 02/06/18 1704)  ketorolac (TORADOL) 30 MG/ML injection 30 mg (  30 mg Intramuscular Given 02/06/18 1704)     ____________________________________________   INITIAL IMPRESSION / ASSESSMENT AND PLAN / ED COURSE  Pertinent labs & imaging results that were available during my care of the patient were reviewed by me and considered in my medical decision making (see chart for details).  Review of the [place] CSRS was performed in accordance of the [facility] prior to dispensing any controlled drugs.    Patient presented to the emergency department for evaluation of back injury.  Vital signs and exam are reassuring.  Patient will be discharged home with prescriptions for Toradol, Flexeril, Lidoderm. Patient is to follow up with PCP as directed. Patient is given ED precautions to return to the ED for any worsening or new symptoms.     ____________________________________________  FINAL CLINICAL IMPRESSION(S) / ED DIAGNOSES  Final diagnoses:  Injury of back, initial encounter      NEW MEDICATIONS STARTED DURING THIS VISIT:  ED Discharge Orders        Ordered    ketorolac (TORADOL) 10 MG tablet  Every 6 hours PRN     02/06/18 1719     cyclobenzaprine (FLEXERIL) 5 MG tablet     02/06/18 1719    lidocaine (LIDODERM) 5 %  Every 24 hours     02/06/18 1719          This chart was dictated using voice recognition software/Dragon. Despite best efforts to proofread, errors can occur which can change the meaning. Any change was purely unintentional.    Enid DerryWagner, Dawayne Ohair, PA-C 02/06/18 Cyndee [id number]    [place], [place], MD 02/06/18 2148

## 2022-03-30 ENCOUNTER — Emergency Department
Admission: EM | Admit: 2022-03-30 | Discharge: 2022-03-30 | Disposition: A | Discharge status: Home / Self Care | Payer: Medicare Other | Attending: Emergency Medicine | Admitting: Emergency Medicine

## 2022-03-30 ENCOUNTER — Other Ambulatory Visit: Payer: Self-pay

## 2022-03-30 DIAGNOSIS — T63461A Toxic effect of venom of wasps, accidental (unintentional), initial encounter: Secondary | ICD-10-CM | POA: Insufficient documentation

## 2022-03-30 DIAGNOSIS — F17200 Nicotine dependence, unspecified, uncomplicated: Secondary | ICD-10-CM | POA: Insufficient documentation

## 2022-03-30 DIAGNOSIS — T7840XA Allergy, unspecified, initial encounter: Secondary | ICD-10-CM

## 2022-03-30 DIAGNOSIS — R062 Wheezing: Secondary | ICD-10-CM | POA: Insufficient documentation

## 2022-03-30 DIAGNOSIS — J449 Chronic obstructive pulmonary disease, unspecified: Secondary | ICD-10-CM | POA: Insufficient documentation

## 2022-03-30 DIAGNOSIS — I10 Essential (primary) hypertension: Secondary | ICD-10-CM | POA: Insufficient documentation

## 2022-03-30 LAB — COMPREHENSIVE METABOLIC PANEL
ALT: 13 U/L (ref 0–44)
AST: 22 U/L (ref 15–41)
Albumin: 3.9 g/dL (ref 3.5–5.0)
Alkaline Phosphatase: 89 U/L (ref 38–126)
Anion gap: 7 (ref 5–15)
BUN: 13 mg/dL (ref 6–20)
CO2: 25 mmol/L (ref 22–32)
Calcium: 8.9 mg/dL (ref 8.9–10.3)
Chloride: 106 mmol/L (ref 98–111)
Creatinine, Ser: 0.86 mg/dL (ref 0.44–1.00)
GFR, Estimated: >60 mL/min (ref >60)
Glucose, Bld: 130 mg/dL — ABNORMAL HIGH (ref 70–99)
Potassium: 4 mmol/L (ref 3.5–5.1)
Sodium: 138 mmol/L (ref 135–145)
Total Bilirubin: 0.7 mg/dL (ref 0.3–1.2)
Total Protein: 7.2 g/dL (ref 6.5–8.1)

## 2022-03-30 LAB — CBC
HCT: 43.6 % (ref 36.0–46.0)
Hemoglobin: 14.4 g/dL (ref 12.0–15.0)
MCH: 28.3 pg (ref 26.0–34.0)
MCHC: 33 g/dL (ref 30.0–36.0)
MCV: 85.7 fL (ref 80.0–100.0)
Platelets: 306 10*3/uL (ref 150–400)
RBC: 5.09 MIL/uL (ref 3.87–5.11)
RDW: 11.9 % (ref 11.5–15.5)
WBC: 10.1 10*3/uL (ref 4.0–10.5)
nRBC: 0 % (ref 0.0–0.2)

## 2022-03-30 MED ORDER — IPRATROPIUM-ALBUTEROL 0.5-2.5 (3) MG/3ML IN SOLN
3.0000 mL | Freq: Once | RESPIRATORY_TRACT | Status: AC
  Administered 2022-03-30: 3 mL via RESPIRATORY_TRACT
  Filled 2022-03-30: qty 3

## 2022-03-30 MED ORDER — PREDNISONE 20 MG PO TABS
40.0000 mg | ORAL_TABLET | Freq: Every day | ORAL | 0 refills | Status: AC
Start: 2022-03-30 — End: 2022-04-04

## 2022-03-30 MED ORDER — METHYLPREDNISOLONE SODIUM SUCC 125 MG IJ SOLR
125.0000 mg | Freq: Once | INTRAMUSCULAR | Status: AC
  Administered 2022-03-30: 125 mg via INTRAVENOUS
  Filled 2022-03-30: qty 2

## 2022-03-30 MED ORDER — SODIUM CHLORIDE 0.9 % IV BOLUS
1000.0000 mL | Freq: Once | INTRAVENOUS | Status: AC
  Administered 2022-03-30: 1000 mL via INTRAVENOUS

## 2022-03-30 MED ORDER — DIPHENHYDRAMINE HCL 50 MG/ML IJ SOLN
25.0000 mg | Freq: Once | INTRAMUSCULAR | Status: AC
  Administered 2022-03-30: 25 mg via INTRAVENOUS
  Filled 2022-03-30: qty 1

## 2022-03-30 MED ORDER — EPINEPHRINE 0.3 MG/0.3ML IJ SOAJ: 0.3000 mg | INTRAMUSCULAR | 1 refills | Status: AC | PRN

## 2022-03-30 NOTE — ED Provider Notes (Signed)
[facility] Provider Note    Event Date/Time   First MD Initiated Contact with Patient 03/30/22 1003     (approximate)  History   Chief Complaint: Allergic Reaction  HPI  Tammie Rojas is a 51 y.o. female with a past medical history of COPD, hypertension, chronic back pain, presents to the emergency department after several wasp stings this morning.  According to the patient she was getting ready to mow the lawn when she must of gone over a wasp nest and was stung multiple times on the left hand per patient.  States she swiped off 2 or 3 wasp from the hand.  Patient has an anaphylactic bee allergy she immediately dosed an EpiPen took 2 Benadryl tablets and called EMS.  EMS gave the patient additional 50 mg of IV Benadryl as well as 20 mg of IV Pepcid and additional 0.5 mg of epinephrine.  Patient states she was having shortness of breath and left hand swelling.  Patient does have a mild wheeze but is also a daily smoker. Physical Exam   Triage Vital Signs: ED Triage Vitals  Enc Vitals Group     BP 03/30/22 1000 (!) 143/78     Pulse Rate 03/30/22 1000 86     Resp 03/30/22 1000 (!) 21     Temp 03/30/22 1002 98.1 F (36.7 C)     Temp Source 03/30/22 1002 Oral     SpO2 03/30/22 1000 100 %     Weight 03/30/22 0957 160 lb (72.6 kg)     Height 03/30/22 0957 5\' 6"  (1.676 m)     Head Circumference --      Peak Flow --      Pain Score 03/30/22 0956 6     Pain Loc --      Pain Edu? --      Excl. in GC? --     Most recent vital signs: Vitals:   03/30/22 1000 03/30/22 1002  BP: (!) 143/78   Pulse: 86   Resp: (!) 21   Temp:  98.1 F (36.7 C)  SpO2: 100%     General: Awake, no distress.  CV:  Good peripheral perfusion.  Regular rate and rhythm  Resp:  Normal effort.  Equal breath sounds bilaterally.  Mild expiratory wheeze bilaterally. Abd:  No distention.  Soft, nontender.  No rebound or guarding. Other:  Moderate swelling of the left hand with mild  erythema.  Swelling extending just proximal to the left wrist.   ED Results / Procedures / Treatments   MEDICATIONS ORDERED IN ED: Medications  methylPREDNISolone sodium succinate (SOLU-MEDROL) 125 mg/2 mL injection 125 mg (has no administration in time range)  sodium chloride 0.9 % bolus 1,000 mL (has no administration in time range)     IMPRESSION / MDM / ASSESSMENT AND PLAN / ED COURSE  I reviewed the triage vital signs and the nursing notes.  Patient's presentation is most consistent with acute presentation with potential threat to life or bodily function.  Patient presents to the emergency department after several wasp stings to her left hand per patient that occurred approximately 30 minutes prior to arrival.  Patient gave himself an epi injection received additional epinephrine by EMS as well as 50 mg of oral Benadryl and then 50 mg IV Benadryl along with 20 mg IV Pepcid.  We will dose 2 breathing treatments given her mild expiratory wheeze although the patient is a chronic smoker.  We will dose IV fluids  as well as IV Solu-Medrol.  We will continue to closely monitor in the emergency department.  Currently patient appears well with a widely patent oropharynx and only localized swelling to the left hand where she was stung.  Patient states she is feeling much better.  Still has mild itching at the hand only no other systemic itching.  No shortness of breath no tongue swelling.  Patient states she is ready to go home.  We will discharge on 5 days of prednisone to help reduce rebound reaction.  We will prescribe a new EpiPen for the patient as well.  Patient agreeable to plan of care.  Discussed my typical allergic reaction return precautions.  FINAL CLINICAL IMPRESSION(S) / ED DIAGNOSES   Allergic reaction    Note:  This document was prepared using Dragon voice recognition software and may include unintentional dictation errors.   Minna Antis, MD 03/30/22 1356

## 2022-03-30 NOTE — ED Notes (Signed)
Pt resting at this time. Respirations equal and unlabored. Plan of care ongoing.

## 2022-03-30 NOTE — ED Triage Notes (Addendum)
Pt to ED via [facility] from home. Pt was about to go mow and got stung by several wasps on the left hand around 0930. Pt has known bee allergy. Pt administered epi pen into top right thigh and 50mg  benadryl PO prior to EMS arrival. EMS administered 0.5mg  Epi, 50mg  benadryl IV and 20mg  famotidine.   On arrival pt left hand swollen and red. Pt endorses SOB and wheezing. Pt reports usually breaks out in rash and has tongue swelling.
